# Patient Record
Sex: Female | Born: 1945 | Race: Black or African American | Hispanic: No
Health system: Southern US, Community
[De-identification: ages and names within clinical notes are randomized; demographics above are authoritative.]

## PROBLEM LIST (undated history)

## (undated) DIAGNOSIS — J45909 Unspecified asthma, uncomplicated: Secondary | ICD-10-CM

## (undated) DIAGNOSIS — I10 Essential (primary) hypertension: Secondary | ICD-10-CM

---

## 2011-07-27 HISTORY — PX: BREAST BIOPSY: SHX20

## 2017-08-03 ENCOUNTER — Other Ambulatory Visit: Payer: Self-pay | Admitting: Family Medicine

## 2017-08-03 DIAGNOSIS — Z1231 Encounter for screening mammogram for malignant neoplasm of breast: Secondary | ICD-10-CM

## 2017-09-19 ENCOUNTER — Encounter: Payer: Self-pay | Admitting: Radiology

## 2017-09-19 ENCOUNTER — Ambulatory Visit
Admission: RE | Admit: 2017-09-19 | Discharge: 2017-09-19 | Disposition: A | Discharge status: Home / Self Care | Payer: Medicare Other | Source: Ambulatory Visit | Attending: Family Medicine | Admitting: Family Medicine

## 2017-09-19 DIAGNOSIS — Z1231 Encounter for screening mammogram for malignant neoplasm of breast: Secondary | ICD-10-CM | POA: Diagnosis present

## 2017-09-23 ENCOUNTER — Other Ambulatory Visit: Payer: Self-pay | Admitting: *Deleted

## 2017-09-23 ENCOUNTER — Inpatient Hospital Stay
Admission: RE | Admit: 2017-09-23 | Discharge: 2017-09-23 | Disposition: A | Discharge status: Home / Self Care | Payer: Self-pay | Source: Ambulatory Visit | Attending: *Deleted | Admitting: *Deleted

## 2017-09-23 DIAGNOSIS — Z9289 Personal history of other medical treatment: Secondary | ICD-10-CM

## 2018-09-04 ENCOUNTER — Other Ambulatory Visit: Payer: Self-pay | Admitting: Family Medicine

## 2018-09-04 DIAGNOSIS — Z1231 Encounter for screening mammogram for malignant neoplasm of breast: Secondary | ICD-10-CM

## 2018-09-22 ENCOUNTER — Ambulatory Visit
Admission: RE | Admit: 2018-09-22 | Discharge: 2018-09-22 | Disposition: A | Discharge status: Home / Self Care | Payer: Medicare Other | Source: Ambulatory Visit | Attending: Family Medicine | Admitting: Family Medicine

## 2018-09-22 DIAGNOSIS — Z1231 Encounter for screening mammogram for malignant neoplasm of breast: Secondary | ICD-10-CM | POA: Insufficient documentation

## 2019-08-29 ENCOUNTER — Other Ambulatory Visit: Payer: Self-pay | Admitting: Family Medicine

## 2019-08-29 DIAGNOSIS — Z1231 Encounter for screening mammogram for malignant neoplasm of breast: Secondary | ICD-10-CM

## 2019-09-16 ENCOUNTER — Ambulatory Visit: Payer: Medicare Other | Attending: Internal Medicine

## 2019-09-16 ENCOUNTER — Other Ambulatory Visit: Payer: Self-pay

## 2019-09-16 DIAGNOSIS — Z23 Encounter for immunization: Secondary | ICD-10-CM

## 2019-09-16 NOTE — Progress Notes (Signed)
   Covid-19 Vaccination Clinic  Name:  Julie Roberts    MRN: [id number] DOB: [birth year]  09/16/2019  Julie Roberts was observed post Covid-19 immunization for 15 minutes without incidence. She was provided with Vaccine Information Sheet and instruction to access the V-Safe system.   Julie Roberts was instructed to call 911 with any severe reactions post vaccine: Marland Kitchen Difficulty breathing  . Swelling of your face and throat  . A fast heartbeat  . A bad rash all over your body  . Dizziness and weakness    Immunizations Administered    Name Date Dose VIS Date Route   Pfizer COVID-19 Vaccine 09/16/2019 12:30 PM 0.3 mL 07/06/2019 Intramuscular   Manufacturer: [employer], [employer]   Lot: [id number]   NDC: [address]-1

## 2019-09-25 ENCOUNTER — Ambulatory Visit
Admission: RE | Admit: 2019-09-25 | Discharge: 2019-09-25 | Disposition: A | Discharge status: Home / Self Care | Payer: Medicare Other | Source: Ambulatory Visit | Attending: Family Medicine | Admitting: Family Medicine

## 2019-09-25 DIAGNOSIS — Z1231 Encounter for screening mammogram for malignant neoplasm of breast: Secondary | ICD-10-CM | POA: Insufficient documentation

## 2019-10-10 ENCOUNTER — Ambulatory Visit: Payer: Medicare Other | Attending: Internal Medicine

## 2019-10-10 DIAGNOSIS — Z23 Encounter for immunization: Secondary | ICD-10-CM

## 2019-10-10 NOTE — Progress Notes (Signed)
   Covid-19 Vaccination Clinic  Name:  Julie Roberts    MRN: [id number] DOB: 11-14-1945  10/10/2019  [name] was observed post Covid-19 immunization for 15 minutes without incident. She was provided with Vaccine Information Sheet and instruction to access the V-Safe system.   [name] was instructed to call 911 with any severe reactions post vaccine: Marland Kitchen Difficulty breathing  . Swelling of face and throat  . A fast heartbeat  . A bad rash all over body  . Dizziness and weakness   Immunizations Administered    Name Date Dose VIS Date Route   Pfizer COVID-19 Vaccine 10/10/2019 12:18 PM 0.3 mL 07/06/2019 Intramuscular   Manufacturer: [employer], [employer]   Lot: [id number]   NDC: [address]-4

## 2019-10-22 ENCOUNTER — Other Ambulatory Visit: Payer: Self-pay | Admitting: Family Medicine

## 2019-10-22 DIAGNOSIS — R1031 Right lower quadrant pain: Secondary | ICD-10-CM

## 2019-10-22 DIAGNOSIS — R5383 Other fatigue: Secondary | ICD-10-CM

## 2019-10-22 DIAGNOSIS — R634 Abnormal weight loss: Secondary | ICD-10-CM

## 2019-11-06 ENCOUNTER — Other Ambulatory Visit: Payer: Self-pay

## 2019-11-06 ENCOUNTER — Ambulatory Visit
Admission: RE | Admit: 2019-11-06 | Discharge: 2019-11-06 | Disposition: A | Discharge status: Home / Self Care | Payer: Medicare Other | Source: Ambulatory Visit | Attending: Family Medicine | Admitting: Family Medicine

## 2019-11-06 DIAGNOSIS — R1032 Left lower quadrant pain: Secondary | ICD-10-CM | POA: Diagnosis present

## 2019-11-06 DIAGNOSIS — R1031 Right lower quadrant pain: Secondary | ICD-10-CM | POA: Diagnosis present

## 2019-11-06 DIAGNOSIS — R5383 Other fatigue: Secondary | ICD-10-CM | POA: Insufficient documentation

## 2019-11-06 DIAGNOSIS — R634 Abnormal weight loss: Secondary | ICD-10-CM | POA: Insufficient documentation

## 2019-11-06 HISTORY — DX: Essential (primary) hypertension: I10

## 2019-11-06 HISTORY — DX: Unspecified asthma, uncomplicated: J45.909

## 2019-11-06 MED ORDER — IOHEXOL 300 MG/ML  SOLN
100.0000 mL | Freq: Once | INTRAMUSCULAR | Status: AC | PRN
  Administered 2019-11-06: 100 mL via INTRAVENOUS

## 2020-07-02 ENCOUNTER — Other Ambulatory Visit: Payer: Self-pay | Admitting: Family Medicine

## 2020-07-02 DIAGNOSIS — R202 Paresthesia of skin: Secondary | ICD-10-CM

## 2020-07-02 DIAGNOSIS — R42 Dizziness and giddiness: Secondary | ICD-10-CM

## 2020-07-02 DIAGNOSIS — R413 Other amnesia: Secondary | ICD-10-CM

## 2020-07-02 DIAGNOSIS — R2681 Unsteadiness on feet: Secondary | ICD-10-CM

## 2020-07-02 DIAGNOSIS — R531 Weakness: Secondary | ICD-10-CM

## 2020-07-11 ENCOUNTER — Ambulatory Visit
Admission: RE | Admit: 2020-07-11 | Discharge: 2020-07-11 | Disposition: A | Discharge status: Home / Self Care | Payer: Medicare Other | Source: Ambulatory Visit | Attending: Family Medicine | Admitting: Family Medicine

## 2020-07-11 ENCOUNTER — Other Ambulatory Visit: Payer: Self-pay

## 2020-07-11 DIAGNOSIS — R531 Weakness: Secondary | ICD-10-CM | POA: Diagnosis present

## 2020-07-11 DIAGNOSIS — R42 Dizziness and giddiness: Secondary | ICD-10-CM | POA: Diagnosis not present

## 2020-07-11 DIAGNOSIS — R413 Other amnesia: Secondary | ICD-10-CM | POA: Insufficient documentation

## 2020-07-11 DIAGNOSIS — R2681 Unsteadiness on feet: Secondary | ICD-10-CM | POA: Diagnosis present

## 2020-07-11 DIAGNOSIS — R202 Paresthesia of skin: Secondary | ICD-10-CM | POA: Diagnosis present

## 2020-07-18 ENCOUNTER — Encounter: Admission: EM | Disposition: E | Payer: Self-pay | Source: Home / Self Care | Attending: Pulmonary Disease

## 2020-07-18 ENCOUNTER — Emergency Department: Payer: Medicare Other

## 2020-07-18 ENCOUNTER — Inpatient Hospital Stay: Payer: Medicare Other

## 2020-07-18 ENCOUNTER — Inpatient Hospital Stay
Admission: EM | Admit: 2020-07-18 | Discharge: 2020-07-26 | DRG: 682 | Disposition: E | Payer: Medicare Other | Attending: Internal Medicine | Admitting: Internal Medicine

## 2020-07-18 DIAGNOSIS — R0600 Dyspnea, unspecified: Secondary | ICD-10-CM

## 2020-07-18 DIAGNOSIS — J9601 Acute respiratory failure with hypoxia: Secondary | ICD-10-CM | POA: Diagnosis not present

## 2020-07-18 DIAGNOSIS — Z515 Encounter for palliative care: Secondary | ICD-10-CM

## 2020-07-18 DIAGNOSIS — J9691 Respiratory failure, unspecified with hypoxia: Secondary | ICD-10-CM

## 2020-07-18 DIAGNOSIS — M1A9XX0 Chronic gout, unspecified, without tophus (tophi): Secondary | ICD-10-CM | POA: Diagnosis present

## 2020-07-18 DIAGNOSIS — D631 Anemia in chronic kidney disease: Secondary | ICD-10-CM | POA: Diagnosis present

## 2020-07-18 DIAGNOSIS — G9341 Metabolic encephalopathy: Secondary | ICD-10-CM | POA: Diagnosis present

## 2020-07-18 DIAGNOSIS — N182 Chronic kidney disease, stage 2 (mild): Secondary | ICD-10-CM | POA: Diagnosis present

## 2020-07-18 DIAGNOSIS — I340 Nonrheumatic mitral (valve) insufficiency: Secondary | ICD-10-CM | POA: Diagnosis not present

## 2020-07-18 DIAGNOSIS — J9600 Acute respiratory failure, unspecified whether with hypoxia or hypercapnia: Secondary | ICD-10-CM

## 2020-07-18 DIAGNOSIS — N179 Acute kidney failure, unspecified: Secondary | ICD-10-CM | POA: Diagnosis present

## 2020-07-18 DIAGNOSIS — I959 Hypotension, unspecified: Secondary | ICD-10-CM | POA: Diagnosis not present

## 2020-07-18 DIAGNOSIS — J45909 Unspecified asthma, uncomplicated: Secondary | ICD-10-CM | POA: Diagnosis present

## 2020-07-18 DIAGNOSIS — Z79899 Other long term (current) drug therapy: Secondary | ICD-10-CM | POA: Diagnosis not present

## 2020-07-18 DIAGNOSIS — J189 Pneumonia, unspecified organism: Secondary | ICD-10-CM | POA: Diagnosis present

## 2020-07-18 DIAGNOSIS — N390 Urinary tract infection, site not specified: Secondary | ICD-10-CM | POA: Diagnosis present

## 2020-07-18 DIAGNOSIS — R680 Hypothermia, not associated with low environmental temperature: Secondary | ICD-10-CM | POA: Diagnosis present

## 2020-07-18 DIAGNOSIS — B9689 Other specified bacterial agents as the cause of diseases classified elsewhere: Secondary | ICD-10-CM | POA: Diagnosis present

## 2020-07-18 DIAGNOSIS — T68XXXA Hypothermia, initial encounter: Secondary | ICD-10-CM

## 2020-07-18 DIAGNOSIS — N178 Other acute kidney failure: Secondary | ICD-10-CM

## 2020-07-18 DIAGNOSIS — I361 Nonrheumatic tricuspid (valve) insufficiency: Secondary | ICD-10-CM | POA: Diagnosis not present

## 2020-07-18 DIAGNOSIS — I129 Hypertensive chronic kidney disease with stage 1 through stage 4 chronic kidney disease, or unspecified chronic kidney disease: Secondary | ICD-10-CM | POA: Diagnosis present

## 2020-07-18 DIAGNOSIS — J9692 Respiratory failure, unspecified with hypercapnia: Secondary | ICD-10-CM

## 2020-07-18 DIAGNOSIS — R001 Bradycardia, unspecified: Secondary | ICD-10-CM

## 2020-07-18 DIAGNOSIS — Z4659 Encounter for fitting and adjustment of other gastrointestinal appliance and device: Secondary | ICD-10-CM

## 2020-07-18 DIAGNOSIS — Z20822 Contact with and (suspected) exposure to covid-19: Secondary | ICD-10-CM | POA: Diagnosis present

## 2020-07-18 DIAGNOSIS — R0603 Acute respiratory distress: Secondary | ICD-10-CM | POA: Diagnosis not present

## 2020-07-18 DIAGNOSIS — I498 Other specified cardiac arrhythmias: Secondary | ICD-10-CM | POA: Diagnosis present

## 2020-07-18 DIAGNOSIS — I468 Cardiac arrest due to other underlying condition: Secondary | ICD-10-CM | POA: Diagnosis not present

## 2020-07-18 DIAGNOSIS — R0602 Shortness of breath: Secondary | ICD-10-CM

## 2020-07-18 DIAGNOSIS — E872 Acidosis: Secondary | ICD-10-CM | POA: Diagnosis present

## 2020-07-18 DIAGNOSIS — E785 Hyperlipidemia, unspecified: Secondary | ICD-10-CM | POA: Diagnosis present

## 2020-07-18 DIAGNOSIS — Z452 Encounter for adjustment and management of vascular access device: Secondary | ICD-10-CM

## 2020-07-18 DIAGNOSIS — M8580 Other specified disorders of bone density and structure, unspecified site: Secondary | ICD-10-CM | POA: Diagnosis present

## 2020-07-18 DIAGNOSIS — Z7982 Long term (current) use of aspirin: Secondary | ICD-10-CM

## 2020-07-18 DIAGNOSIS — Z66 Do not resuscitate: Secondary | ICD-10-CM | POA: Diagnosis not present

## 2020-07-18 DIAGNOSIS — R4182 Altered mental status, unspecified: Secondary | ICD-10-CM | POA: Diagnosis not present

## 2020-07-18 DIAGNOSIS — Z01818 Encounter for other preprocedural examination: Secondary | ICD-10-CM

## 2020-07-18 HISTORY — PX: TEMPORARY PACEMAKER: CATH118268

## 2020-07-18 LAB — CBC WITH DIFFERENTIAL/PLATELET
Abs Immature Granulocytes: 0.12 10*3/uL — ABNORMAL HIGH (ref 0.00–0.07)
Basophils Absolute: 0 10*3/uL (ref 0.0–0.1)
Basophils Relative: 0 %
Eosinophils Absolute: 0.1 10*3/uL (ref 0.0–0.5)
Eosinophils Relative: 0 %
HCT: 25.3 % — ABNORMAL LOW (ref 36.0–46.0)
Hemoglobin: 8.6 g/dL — ABNORMAL LOW (ref 12.0–15.0)
Immature Granulocytes: 1 %
Lymphocytes Relative: 19 %
Lymphs Abs: 2.1 10*3/uL (ref 0.7–4.0)
MCH: 31.4 pg (ref 26.0–34.0)
MCHC: 34 g/dL (ref 30.0–36.0)
MCV: 92.3 fL (ref 80.0–100.0)
Monocytes Absolute: 0.5 10*3/uL (ref 0.1–1.0)
Monocytes Relative: 5 %
Neutro Abs: 8.5 10*3/uL — ABNORMAL HIGH (ref 1.7–7.7)
Neutrophils Relative %: 75 %
Platelets: 142 10*3/uL — ABNORMAL LOW (ref 150–400)
RBC: 2.74 MIL/uL — ABNORMAL LOW (ref 3.87–5.11)
RDW: 16.3 % — ABNORMAL HIGH (ref 11.5–15.5)
Smear Review: NORMAL
WBC: 11.4 10*3/uL — ABNORMAL HIGH (ref 4.0–10.5)
nRBC: 4.6 % — ABNORMAL HIGH (ref 0.0–0.2)

## 2020-07-18 LAB — BASIC METABOLIC PANEL
Anion gap: 10 (ref 5–15)
BUN: 55 mg/dL — ABNORMAL HIGH (ref 8–23)
CO2: 20 mmol/L — ABNORMAL LOW (ref 22–32)
Calcium: 9.7 mg/dL (ref 8.9–10.3)
Chloride: 103 mmol/L (ref 98–111)
Creatinine, Ser: 3.49 mg/dL — ABNORMAL HIGH (ref 0.44–1.00)
GFR, Estimated: 13 mL/min — ABNORMAL LOW (ref >60)
Glucose, Bld: 121 mg/dL — ABNORMAL HIGH (ref 70–99)
Potassium: 5.5 mmol/L — ABNORMAL HIGH (ref 3.5–5.1)
Sodium: 133 mmol/L — ABNORMAL LOW (ref 135–145)

## 2020-07-18 LAB — RESP PANEL BY RT-PCR (FLU A&B, COVID) ARPGX2
Influenza A by PCR: NEGATIVE
Influenza B by PCR: NEGATIVE
SARS Coronavirus 2 by RT PCR: NEGATIVE

## 2020-07-18 LAB — CBG MONITORING, ED: Glucose-Capillary: 106 mg/dL — ABNORMAL HIGH (ref 70–99)

## 2020-07-18 LAB — COMPREHENSIVE METABOLIC PANEL
ALT: 56 U/L — ABNORMAL HIGH (ref 0–44)
AST: 61 U/L — ABNORMAL HIGH (ref 15–41)
Albumin: 3.1 g/dL — ABNORMAL LOW (ref 3.5–5.0)
Alkaline Phosphatase: 152 U/L — ABNORMAL HIGH (ref 38–126)
Anion gap: 13 (ref 5–15)
BUN: 56 mg/dL — ABNORMAL HIGH (ref 8–23)
CO2: 20 mmol/L — ABNORMAL LOW (ref 22–32)
Calcium: 10.4 mg/dL — ABNORMAL HIGH (ref 8.9–10.3)
Chloride: 98 mmol/L (ref 98–111)
Creatinine, Ser: 3.31 mg/dL — ABNORMAL HIGH (ref 0.44–1.00)
GFR, Estimated: 14 mL/min — ABNORMAL LOW (ref >60)
Glucose, Bld: 128 mg/dL — ABNORMAL HIGH (ref 70–99)
Potassium: 5.5 mmol/L — ABNORMAL HIGH (ref 3.5–5.1)
Sodium: 131 mmol/L — ABNORMAL LOW (ref 135–145)
Total Bilirubin: 0.5 mg/dL (ref 0.3–1.2)
Total Protein: 6.2 g/dL — ABNORMAL LOW (ref 6.5–8.1)

## 2020-07-18 LAB — LACTIC ACID, PLASMA
Lactic Acid, Venous: 3.3 mmol/L (ref 0.5–1.9)
Lactic Acid, Venous: 2.5 mmol/L (ref 0.5–1.9)

## 2020-07-18 LAB — PROTIME-INR
INR: 1 (ref 0.8–1.2)
Prothrombin Time: 12.6 seconds (ref 11.4–15.2)

## 2020-07-18 LAB — APTT: aPTT: 41 seconds — ABNORMAL HIGH (ref 24–36)

## 2020-07-18 LAB — MRSA PCR SCREENING: MRSA by PCR: NEGATIVE

## 2020-07-18 LAB — GLUCOSE, CAPILLARY
Glucose-Capillary: 122 mg/dL — ABNORMAL HIGH (ref 70–99)
Glucose-Capillary: 111 mg/dL — ABNORMAL HIGH (ref 70–99)

## 2020-07-18 SURGERY — TEMPORARY PACEMAKER

## 2020-07-18 MED ORDER — DOPAMINE-DEXTROSE 3.2-5 MG/ML-% IV SOLN
0.0000 ug/kg/min | INTRAVENOUS | Status: DC
  Administered 2020-07-18: 18 ug/kg/min via INTRAVENOUS
  Administered 2020-07-18: 5 ug/kg/min via INTRAVENOUS
  Filled 2020-07-18: qty 250

## 2020-07-18 MED ORDER — CHLORHEXIDINE GLUCONATE CLOTH 2 % EX PADS
6.0000 | MEDICATED_PAD | Freq: Every day | CUTANEOUS | Status: DC
  Administered 2020-07-19 – 2020-07-24 (×5): 6 via TOPICAL

## 2020-07-18 MED ORDER — VASOPRESSIN 20 UNITS/100 ML INFUSION FOR SHOCK
0.0000 [IU]/min | INTRAVENOUS | Status: DC
  Administered 2020-07-18: 23:00:00 0.03 [IU]/min via INTRAVENOUS
  Administered 2020-07-19: 06:00:00 0.04 [IU]/min via INTRAVENOUS
  Filled 2020-07-18 (×4): qty 100

## 2020-07-18 MED ORDER — SODIUM CHLORIDE 0.9 % IV SOLN: INTRAVENOUS | Status: DC

## 2020-07-18 MED ORDER — MIDAZOLAM HCL 2 MG/2ML IJ SOLN
INTRAMUSCULAR | Status: AC
  Filled 2020-07-18: qty 2

## 2020-07-18 MED ORDER — FENTANYL CITRATE (PF) 100 MCG/2ML IJ SOLN
INTRAMUSCULAR | Status: AC
  Filled 2020-07-18: qty 2

## 2020-07-18 MED ORDER — DOPAMINE-DEXTROSE 3.2-5 MG/ML-% IV SOLN: 0.0000 ug/kg/min | INTRAVENOUS | Status: DC

## 2020-07-18 MED ORDER — FENTANYL CITRATE (PF) 100 MCG/2ML IJ SOLN: 50.0000 ug | Freq: Once | INTRAMUSCULAR | Status: AC

## 2020-07-18 MED ORDER — LIDOCAINE HCL (PF) 1 % IJ SOLN
INTRAMUSCULAR | Status: AC
  Filled 2020-07-18: qty 30

## 2020-07-18 MED ORDER — HEPARIN (PORCINE) IN NACL 1000-0.9 UT/500ML-% IV SOLN
INTRAVENOUS | Status: AC
  Filled 2020-07-18: qty 1000

## 2020-07-18 MED ORDER — MIDAZOLAM HCL 2 MG/2ML IJ SOLN: 2.0000 mg | Freq: Once | INTRAMUSCULAR | Status: AC

## 2020-07-18 MED ORDER — SODIUM BICARBONATE 8.4 % IV SOLN
50.0000 meq | Freq: Once | INTRAVENOUS | Status: AC
  Administered 2020-07-18: 23:00:00 50 meq via INTRAVENOUS
  Filled 2020-07-18: qty 50

## 2020-07-18 MED ORDER — FENTANYL CITRATE (PF) 100 MCG/2ML IJ SOLN
INTRAMUSCULAR | Status: AC
  Administered 2020-07-18: 50 ug via INTRAVENOUS
  Filled 2020-07-18: qty 2

## 2020-07-18 MED ORDER — HEPARIN (PORCINE) IN NACL 1000-0.9 UT/500ML-% IV SOLN
INTRAVENOUS | Status: DC | PRN
  Administered 2020-07-18 (×2): 500 mL

## 2020-07-18 MED ORDER — LIDOCAINE HCL (PF) 1 % IJ SOLN
INTRAMUSCULAR | Status: DC | PRN
  Administered 2020-07-18: 15 mL

## 2020-07-18 MED ORDER — DOPAMINE-DEXTROSE 3.2-5 MG/ML-% IV SOLN
0.0000 ug/kg/min | INTRAVENOUS | Status: DC
  Administered 2020-07-18 – 2020-07-19 (×3): 20 ug/kg/min via INTRAVENOUS
  Administered 2020-07-20: 5 ug/kg/min via INTRAVENOUS
  Filled 2020-07-18 (×3): qty 250

## 2020-07-18 MED ORDER — MIDAZOLAM HCL 2 MG/2ML IJ SOLN
INTRAMUSCULAR | Status: AC
  Administered 2020-07-18: 2 mg via INTRAVENOUS
  Filled 2020-07-18: qty 2

## 2020-07-18 MED ORDER — DOPAMINE-DEXTROSE 3.2-5 MG/ML-% IV SOLN
INTRAVENOUS | Status: AC
  Administered 2020-07-18: 5 ug/kg/min via INTRAVENOUS
  Filled 2020-07-18: qty 250

## 2020-07-18 SURGICAL SUPPLY — 8 items
CABLE ADAPT PACING TEMP 12FT (ADAPTER) ×2 IMPLANT
CANNULA 5F STIFF (CANNULA) ×2 IMPLANT
KIT MANI 3VAL PERCEP (MISCELLANEOUS) ×2 IMPLANT
NEEDLE PERC 18GX7CM (NEEDLE) ×2 IMPLANT
PACK CARDIAC CATH (CUSTOM PROCEDURE TRAY) ×2 IMPLANT
SHEATH AVANTI 6FR X 11CM (SHEATH) ×2 IMPLANT
SLEEVE REPOSITIONING LENGTH 30 (MISCELLANEOUS) ×2 IMPLANT
WIRE PACING TEMP ST TIP 5 (CATHETERS) ×2 IMPLANT

## 2020-07-18 NOTE — ED Notes (Signed)
CODE STEMI   CALLED  TO  [facility] PER  DR  GOODMAN  MD 

## 2020-07-18 NOTE — ED Notes (Signed)
Bear warmer placed on pt. Warm fluid bolus given

## 2020-07-18 NOTE — ED Notes (Addendum)
Pt paced at 46ma

## 2020-07-18 NOTE — ED Triage Notes (Signed)
Pt to ED via [facility] from across the street. Daughter called EMS due to AMS. Upon arrival pt HR in the 20s. EMS initiated transcutaneous pacing at 26ma 60bmp. EMS unable to obtain 12 -lead prior to initiating pacing.   CBG 160. BP 146/121. 20g RAC. Unknown hx at this time.

## 2020-07-18 NOTE — ED Provider Notes (Signed)
[facility] Emergency Department Provider Note  ____________________________________________   I have reviewed the triage vital signs and the nursing notes.   HISTORY  Chief Complaint Decreased responsiveness  History limited by: Altered Mental Status   HPI Julie Roberts is a 74 y.o. female who presents to the emergency department today because of concern for decreased responsiveness. The patient is unable to give any history. Per ems daughter at scene stated patient had not been doing well for weeks. They stated that she is being worked up for possible dementia. When they arrived on scene the found the patient to be unresponsive with significant bradycardia. They did start pacing the patient with improvement in the patient's alertness.  Records reviewed. Per medical record review patient has a history of asthma, hypertension.  Past Medical History:  Diagnosis Date   Asthma    Hypertension     There are no problems to display for this patient.   Past Surgical History:  Procedure Laterality Date   BREAST BIOPSY Left 2013   neg    Prior to Admission medications   Not on File    Allergies Patient has no allergy information on record.  Family History  Problem Relation Age of Onset   Breast cancer Neg Hx     Social History    Review of Systems Unable to obtain secondary to AMS ____________________________________________   PHYSICAL EXAM:  VITAL SIGNS: ED Triage Vitals  Enc Vitals Group     BP 07/08/2020 1533 (!) 149/129     Pulse Rate 07/15/2020 1533 60     Resp 07/08/2020 1533 15     Temp 07/08/2020 1542 (!) 86.3 F (30.2 C)     Temp Source 07/17/2020 1542 Rectal     SpO2 07/17/2020 1533 100 %   Constitutional: Somnolent, awakens to loud verbal stimuli.  Eyes: Conjunctivae are normal.  ENT      Head: Normocephalic and atraumatic.      Nose: No congestion/rhinnorhea.      Mouth/Throat: Mucous membranes are moist.      Neck: No  stridor. Hematological/Lymphatic/Immunilogical: No cervical lymphadenopathy. Cardiovascular: Normal rate, regular rhythm.  No murmurs, rubs, or gallops.  Respiratory: Normal respiratory effort without tachypnea nor retractions. Breath sounds are clear and equal bilaterally. No wheezes/rales/rhonchi. Gastrointestinal: Soft and non tender. No rebound. No guarding.  Genitourinary: Deferred Musculoskeletal: Normal range of motion in all extremities. No lower extremity edema. Neurologic:  Somnolent.  Skin:  Skin is warm, dry and intact. No rash noted.  ____________________________________________    LABS (pertinent positives/negatives)  COVID negative Lactic acid 3.3 CMP na 131, k 5.5, glu 128, cr 3.31 CBC wbc 11.4, hgb 8.6, plt 142  ____________________________________________   EKG  I, Phineas Semen, attending physician, personally viewed and interpreted this EKG  EKG Time: 1531 Rate: 18 Rhythm: no obvious sinus rhythm, possible bradycardic junctional rhythm Axis: unable to determine Intervals: no obvious p waves, qtc appears normal QRS: narrow ST changes: no st elevation Impression: abnormal ekg   ____________________________________________    RADIOLOGY  CXR Cardiomegaly with pulmonary edema. Question atypical infection  ____________________________________________   PROCEDURES  Procedures  CRITICAL CARE Performed by: Phineas Semen   Total critical care time: 35 minutes  Critical care time was exclusive of separately billable procedures and treating other patients.  Critical care was necessary to treat or prevent imminent or life-threatening deterioration.  Critical care was time spent personally by me on the following activities: development of treatment plan with patient and/or  surrogate as well as nursing, discussions with consultants, evaluation of patient's response to treatment, examination of patient, obtaining history from patient or surrogate,  ordering and performing treatments and interventions, ordering and review of laboratory studies, ordering and review of radiographic studies, pulse oximetry and re-evaluation of patient's condition.  ____________________________________________   INITIAL IMPRESSION / ASSESSMENT AND PLAN / ED COURSE  Pertinent labs & imaging results that were available during my care of the patient were reviewed by me and considered in my medical decision making (see chart for details).   Patient presented to the emergency room via EMS because of concerns for decreased responsiveness and having been found to be significantly bradycardic.  Patient was being paced by EMS upon arrival to the emergency department.  Patient did have EKG performed while being transferred to our pacer pads.  This showed significant bradycardia with roughly 18 bpm.  No obvious P waves.  She however did well on external pacing.  She did become somewhat more alert although was in some significant discomfort.  Because of this she was given medications to help with the pain and discomfort.  I did discuss with Dr. Kirke Corin with cardiology who graciously agreed to come to the emergency department to evaluate and potentially place temporary pacer.  Patient will be admitted to the ICU.   ____________________________________________   FINAL CLINICAL IMPRESSION(S) / ED DIAGNOSES  Final diagnoses:  Hypothermia, initial encounter  Bradycardia  Altered mental status, unspecified altered mental status type     Note: This dictation was prepared with Dragon dictation. Any transcriptional errors that result from this process are unintentional     Phineas Semen, MD 06/30/2020 [phone]

## 2020-07-18 NOTE — ED Notes (Addendum)
Pt placed on ED monitor. Pacing stopped momentarily to obtain EKG. HR reading 18bmp. Pacing initiated again

## 2020-07-18 NOTE — H&P (Signed)
CRITICAL CARE PROGRESS NOTE    Name: Julie Roberts MRN: [id number] DOB: 04/01/1946     LOS: 0   SUBJECTIVE FINDINGS & SIGNIFICANT EVENTS    Patient description:   74 yo F PMH as below, daughter gives history as patient is with encephalopathy.  Daughter states despite significant comorbid history she was able to drive 1 month ago and does all ADLs on her own.  She has been unsteady on feet with disequilibrium over past 2 months and she has had MRI brian for possible CVA on outpatient.  She was brought to ED due to worsening dizziness and altered mentation and found to have profound bradycardia in the teens.She had transcutaneous pacing placed. She was emergently seen by cardiology who was able to place transvenous TPM.  She was also noted to have A/CKD and hypothermia with admission to MICU for additional evaluation and management.   Lines/tubes :   Microbiology/Sepsis markers: Results for orders placed or performed during the hospital encounter of 07/22/2020  Resp Panel by RT-PCR (Flu A&B, Covid) Nasopharyngeal Swab     Status: None   Collection Time: 07/08/2020  4:00 PM   Specimen: Nasopharyngeal Swab; Nasopharyngeal(NP) swabs in vial transport medium  Result Value Ref Range Status   SARS Coronavirus 2 by RT PCR NEGATIVE NEGATIVE Final    Comment: (NOTE) SARS-CoV-2 target nucleic acids are NOT DETECTED.  The SARS-CoV-2 RNA is generally detectable in upper respiratory specimens during the acute phase of infection. The lowest concentration of SARS-CoV-2 viral copies this assay can detect is 138 copies/mL. A negative result does not preclude SARS-Cov-2 infection and should not be used as the sole basis for treatment or other patient management decisions. A negative result may occur with  improper specimen  collection/handling, submission of specimen other than nasopharyngeal swab, presence of viral mutation(s) within the areas targeted by this assay, and inadequate number of viral copies(<138 copies/mL). A negative result must be combined with clinical observations, patient history, and epidemiological information. The expected result is Negative.  Fact Sheet for Patients:  BloggerCourse.comhttps://www.fda.gov/media/152166/download  Fact Sheet for Healthcare Providers:  SeriousBroker.ithttps://www.fda.gov/media/152162/download  This test is no t yet approved or cleared by the [place] FDA and  has been authorized for detection and/or diagnosis of SARS-CoV-2 by FDA under an Emergency Use Authorization (EUA). This EUA will remain  in effect (meaning this test can be used) for the duration of the COVID-19 declaration under Section 564(b)(1) of the Act, 21 U.S.C.section 360bbb-3(b)(1), unless the authorization is terminated  or revoked sooner.       Influenza A by PCR NEGATIVE NEGATIVE Final   Influenza B by PCR NEGATIVE NEGATIVE Final    Comment: (NOTE) The Xpert Xpress SARS-CoV-2/FLU/RSV plus assay is intended as an aid in the diagnosis of influenza from Nasopharyngeal swab specimens and should not be used as a sole basis for treatment. Nasal washings and aspirates are unacceptable for Xpert Xpress SARS-CoV-2/FLU/RSV testing.  Fact Sheet for Patients: BloggerCourse.comhttps://www.fda.gov/media/152166/download  Fact Sheet for Healthcare Providers: SeriousBroker.ithttps://www.fda.gov/media/152162/download  This test is not yet approved or cleared by the [place] FDA and has been authorized for detection and/or diagnosis of SARS-CoV-2 by FDA under an Emergency Use Authorization (EUA). This EUA will remain in effect (meaning this test can be used) for the duration of the COVID-19 declaration under Section 564(b)(1) of the Act, 21 U.S.C. section 360bbb-3(b)(1), unless the authorization is terminated or revoked.  Performed at [facility], [address] [place], [place] [address]  Anti-infectives:  Anti-infectives (From admission, onward)   None       Consults: Treatment Team:  Iran Ouch, MD      PAST MEDICAL HISTORY   Past Medical History:  Diagnosis Date  . Asthma   . Hypertension      SURGICAL HISTORY   Past Surgical History:  Procedure Laterality Date  . BREAST BIOPSY Left 2013   neg     FAMILY HISTORY   Family History  Problem Relation Age of Onset  . Breast cancer Neg Hx      SOCIAL HISTORY       MEDICATIONS   Current Medication:  Current Facility-Administered Medications:  .  0.9 %  sodium chloride infusion, , Intravenous, Continuous, Arida, Chelsea Aus, MD .  Melene Muller ON 07/19/2020] Chlorhexidine Gluconate Cloth 2 % PADS 6 each, 6 each, Topical, Q0600, Karna Christmas, Yasmene Salomone, MD .  DOPamine (INTROPIN) 800 mg in dextrose 5 % 250 mL (3.2 mg/mL) infusion, 0-20 mcg/kg/min, Intravenous, Continuous, Arida, Muhammad A, MD, Last Rate: 6.81 mL/hr at 07/17/2020 1626, 5 mcg/kg/min at 07/17/2020 1626    ALLERGIES   Patient has no allergy information on record.    REVIEW OF SYSTEMS    Unable to obtain due to encephalopathy  PHYSICAL EXAMINATION   Vital Signs: Temp:  [85.9 F (29.9 C)-86.9 F (30.5 C)] 86.9 F (30.5 C) (12/24 1748) Pulse Rate:  [53-60] 53 (12/24 1748) Resp:  [5-27] 17 (12/24 1748) BP: (67-149)/(27-129) 107/47 (12/24 1748) SpO2:  [94 %-100 %] 100 % (12/24 1748) Weight:  [72.6 kg] 72.6 kg (12/24 1620)  GENERAL:age appropriate HEAD: Normocephalic, atraumatic.  EYES: Pupils equal, round, reactive to light.  No scleral icterus.  MOUTH: Moist mucosal membrane. NECK: Supple. No thyromegaly. No nodules. No JVD.  PULMONARY: decreased air entry bilaterally  CARDIOVASCULAR: S1 and S2. Regular rate and rhythm. No murmurs, rubs, or gallops.  GASTROINTESTINAL: Soft, nontender, non-distended. No masses. Positive bowel sounds. No hepatosplenomegaly.   MUSCULOSKELETAL: No swelling, clubbing, or edema.  NEUROLOGIC: Mild distress due to acute illness SKIN:intact,warm,dry   PERTINENT DATA     Infusions: . sodium chloride    . DOPamine 5 mcg/kg/min (07/08/2020 1626)   Scheduled Medications: . [START ON 07/19/2020] Chlorhexidine Gluconate Cloth  6 each Topical Q0600   PRN Medications:  Hemodynamic parameters:   Intake/Output: No intake/output data recorded.  Ventilator  Settings:     LAB RESULTS:  Basic Metabolic Panel: Recent Labs  Lab 07/07/2020 1600  NA 131*  K 5.5*  CL 98  CO2 20*  GLUCOSE 128*  BUN 56*  CREATININE 3.31*  CALCIUM 10.4*   Liver Function Tests: Recent Labs  Lab 07/12/2020 1600  AST 61*  ALT 56*  ALKPHOS 152*  BILITOT 0.5  PROT 6.2*  ALBUMIN 3.1*   No results for input(s): LIPASE, AMYLASE in the last 168 hours. No results for input(s): AMMONIA in the last 168 hours. CBC: Recent Labs  Lab 07/05/2020 1600  WBC 11.4*  NEUTROABS 8.5*  HGB 8.6*  HCT 25.3*  MCV 92.3  PLT 142*   Cardiac Enzymes: No results for input(s): CKTOTAL, CKMB, CKMBINDEX, TROPONINI in the last 168 hours. BNP: Invalid input(s): POCBNP CBG: Recent Labs  Lab 07/13/2020 1547  GLUCAP 106*       IMAGING RESULTS:  Imaging: CARDIAC CATHETERIZATION  Result Date: 06/29/2020 Successful transvenous pacemaker placement via the right common femoral vein. Recommendations: During pacemaker placement, the patient was noted to have some intrinsic rhythm with heart rate in the high 40s  and low 50s.  Thus, I elected to start the pacemaker rate at 40 bpm to allow intrinsic rhythm.  The patient's hypotension improved with IV fluids and dopamine. Recommend admission to the intensive care unit for further work-up of hypothermia, anemia and acute renal failure. Continue to hold all antihypertensive medications including metoprolol which is the likely culprit for bradycardia in the setting of acute renal failure with subsequent  accumulation. I suspect that the patient's most likely will not require permanent pacemaker.  Once we know that her bradycardia has resolved, the temporary pacemaker can be removed.  DG Chest Port 1 View  Result Date: 06/25/2020 CLINICAL DATA:  74 year old female status post pacemaker placement. EXAM: PORTABLE CHEST 1 VIEW COMPARISON:  Chest radiograph dated 07/22/2020. FINDINGS: There is cardiomegaly with vascular congestion and probable mild edema. Patchy area of density in the left mid lung field may represent edema or pneumonia. No pleural effusion pneumothorax. No acute osseous pathology. A pacer wire noted with tip over the spine. IMPRESSION: Mild cardiomegaly and mild vascular congestion. Patchy density in the left mid lung field may represent edema or pneumonia. No pneumothorax. Electronically Signed   By: Elgie Collard M.D.   On: 07/20/2020 18:06   DG Chest Port 1 View  Result Date: 07/05/2020 CLINICAL DATA:  Questionable sepsis. EXAM: PORTABLE CHEST 1 VIEW COMPARISON:  None. FINDINGS: The heart size is enlarged. Evaluation of the left lung field is limited by an overlying defibrillator pad. There appear to be diffuse hazy airspace opacities in the left lung field and to a lesser degree the right. Kerley B lines are noted. There are probable trace bilateral pleural effusions. There is no pneumothorax. There is an old left clavicle fracture. No definite acute displaced fracture. IMPRESSION: 1. Cardiomegaly with pulmonary edema. 2. Probable trace bilateral pleural effusions. 3. Hazy airspace opacities, left greater than right, may represent an atypical infectious process or developing pulmonary edema. Electronically Signed   By: Katherine Mantle M.D.   On: 07/12/2020 16:14   @PROBHOSP @ CARDIAC CATHETERIZATION  Result Date: 07/13/2020 Successful transvenous pacemaker placement via the right common femoral vein. Recommendations: During pacemaker placement, the patient was noted to have some  intrinsic rhythm with heart rate in the high 40s and low 50s.  Thus, I elected to start the pacemaker rate at 40 bpm to allow intrinsic rhythm.  The patient's hypotension improved with IV fluids and dopamine. Recommend admission to the intensive care unit for further work-up of hypothermia, anemia and acute renal failure. Continue to hold all antihypertensive medications including metoprolol which is the likely culprit for bradycardia in the setting of acute renal failure with subsequent accumulation. I suspect that the patient's most likely will not require permanent pacemaker.  Once we know that her bradycardia has resolved, the temporary pacemaker can be removed.  DG Chest Port 1 View  Result Date: 06/26/2020 CLINICAL DATA:  74 year old female status post pacemaker placement. EXAM: PORTABLE CHEST 1 VIEW COMPARISON:  Chest radiograph dated 07/10/2020. FINDINGS: There is cardiomegaly with vascular congestion and probable mild edema. Patchy area of density in the left mid lung field may represent edema or pneumonia. No pleural effusion pneumothorax. No acute osseous pathology. A pacer wire noted with tip over the spine. IMPRESSION: Mild cardiomegaly and mild vascular congestion. Patchy density in the left mid lung field may represent edema or pneumonia. No pneumothorax. Electronically Signed   By: 07/20/2020 M.D.   On: 07/23/2020 18:06   DG Chest Port 1 View  Result Date:  07/25/2020 CLINICAL DATA:  Questionable sepsis. EXAM: PORTABLE CHEST 1 VIEW COMPARISON:  None. FINDINGS: The heart size is enlarged. Evaluation of the left lung field is limited by an overlying defibrillator pad. There appear to be diffuse hazy airspace opacities in the left lung field and to a lesser degree the right. Kerley B lines are noted. There are probable trace bilateral pleural effusions. There is no pneumothorax. There is an old left clavicle fracture. No definite acute displaced fracture. IMPRESSION: 1. Cardiomegaly with  pulmonary edema. 2. Probable trace bilateral pleural effusions. 3. Hazy airspace opacities, left greater than right, may represent an atypical infectious process or developing pulmonary edema. Electronically Signed   By: Katherine Mantle M.D.   On: 07/17/2020 16:14     ASSESSMENT AND PLAN    -Multidisciplinary rounds held today  Symptomatic bradyarrythmia   Cardiology on case - Dr Kirke Corin - appreciate input - likely due to CKD with reaccumulation of BB -please follow cardiology recommendations   Acute on chronic renal failure  - d/c nonessential nephrotoxins ICU monitoring -follow chem 7 -follow UO -continue Foley Catheter-assess need daily   Altered mental status with encephalopathy    -patient is at risk of inability to protect airway    - likely due to toxic metabolic encephalopathy Wake up assessment pending   ID -continue IV abx as prescibed -follow up cultures  GI/Nutrition GI PROPHYLAXIS as indicated DIET-->TF's as tolerated Constipation protocol as indicated  ENDO - ICU hypoglycemic\Hyperglycemia protocol -check FSBS per protocol   ELECTROLYTES -follow labs as needed -replace as needed -pharmacy consultation   DVT/GI PRX ordered -SCDs  TRANSFUSIONS AS NEEDED MONITOR FSBS ASSESS the need for LABS as needed   Critical care provider statement:    Critical care time (minutes):  109   Critical care time was exclusive of:  Separately billable procedures and treating other patients   Critical care was necessary to treat or prevent imminent or life-threatening deterioration of the following conditions:  altered mental status, encephalopathy, symptomatic bradyarrythmia, Acute on chronic renal failure, multiple comorbid conditions.   Critical care was time spent personally by me on the following activities:  Development of treatment plan with patient or surrogate, discussions with consultants, evaluation of patient's response to treatment, examination of  patient, obtaining history from patient or surrogate, ordering and performing treatments and interventions, ordering and review of laboratory studies and re-evaluation of patient's condition.  I assumed direction of critical care for this patient from another provider in my specialty: no    This document was prepared using Dragon voice recognition software and may include unintentional dictation errors.    Vida Rigger, M.D.  Division of Pulmonary & Critical Care Medicine  [facility]

## 2020-07-18 NOTE — Progress Notes (Signed)
Clarified pts code status with pts daughter Melvia Heaps via telephone and provided an update regarding pts condition.  Mrs. Tiburcio Pea stated her mother is a FULL CODE, therefore will place order in pts chart.  Sonda Rumble, AGNP  Pulmonary/Critical Care Pager [phone] (please enter 7 digits) PCCM Consult Pager [phone] (please enter 7 digits)

## 2020-07-18 NOTE — Consult Note (Signed)
Cardiology Consultation:   Patient ID: Julie Roberts MRN: [id number]; DOB: [birth year]  Admit date: 07/10/2020 Date of Consult: 07/14/2020  Primary Care Provider: Wilford Corner, PA-C [facility] Cardiologist: No primary care provider on file.  [facility] Electrophysiologist:  None    Patient Profile:   Julie Roberts is a 74 y.o. female with a hx of essential hypertension, hyperlipidemia, chronic kidney disease, COPD and recent memory problems who is being seen today for the evaluation of symptomatic bradycardia at the request of Dr. Derrill Kay.  History of Present Illness:   Ms. Lafavor is a 74 year old female with above medical problems.  The patient has been having recent issues with worsening memory and balance problems and had a recent MRI.  She lives by herself.  She was last seen by her family on Wednesday and overall was not feeling great.  They went to see her today and she was minimally responsive.  EMS were called and the patient was noted to be bradycardic with heart rate around 15 bpm.  She was transcutaneously paced and transferred to the emergency department.  She was minimally responsive but started talking some as she arrived to the ED.  She was noted to be hypertensive in the ED with systolic blood pressure in the 70s.  She was given IV fluids and then started on dopamine for hypotension and bradycardia.  The patient was being transcutaneously paced and was very uncomfortable as a result.  She was given Versed.  Given severe bradycardia, I was called for transvenous pacemaker placement.  The patient was noted to be hypothermic with temperature of 85.9.  The daughter was at the bedside and I discussed with her the uncertainty of the diagnosis at this time but I felt that it was not practical to continue transcutaneous pacing with the level of discomfort that she was having and it would be best to place a temporary transvenous pacemaker at least until we identify the  cause of bradycardia, hypotension and hypothermia.  I also discussed with Dr. Derrill Kay in the ED as well as Dr. Rennis Harding care of critical care. The patient takes multiple antihypertensive medications at home including metoprolol 50 mg twice daily.   Past Medical History:  Diagnosis Date  . Asthma   . Hypertension     Past Surgical History:  Procedure Laterality Date  . BREAST BIOPSY Left 2013   neg     Home Medications:  Prior to Admission medications   Medication Sig Start Date End Date Taking? Authorizing Provider  allopurinol (ZYLOPRIM) 100 MG tablet Take 200 mg by mouth daily. 05/05/20   [provider]  amLODipine (NORVASC) 10 MG tablet Take 10 mg by mouth daily. 05/05/20   [provider]  aspirin 81 MG EC tablet Take 81 mg by mouth daily at 6 (six) AM.    [provider]  atorvastatin (LIPITOR) 20 MG tablet Take 20 mg by mouth daily. 07/16/20   [provider]  fluticasone (FLONASE) 50 MCG/ACT nasal spray Place 1 spray into both nostrils daily at 6 (six) AM. 07/04/20   [provider]  hydroxychloroquine (PLAQUENIL) 200 MG tablet Take 200 mg by mouth 2 (two) times daily. 05/05/20   [provider]  levocetirizine (XYZAL) 5 MG tablet Take 5 mg by mouth daily. 05/06/20   [provider]  losartan (COZAAR) 100 MG tablet Take 100 mg by mouth daily. 05/05/20   [provider]  Omega-3 Fatty Acids (FISH OIL) 1360 MG CAPS Take 2 capsules  by mouth daily at 6 (six) AM.    [provider]    Inpatient Medications: Scheduled Meds:  Continuous Infusions: . DOPamine 5 mcg/kg/min (2020-06-04 1626)   PRN Meds: Heparin (Porcine) in NaCl, lidocaine (PF)  Allergies:   Not on File  Social History:   Social History   Socioeconomic History  . Marital status: Divorced    Spouse name: Not on file  . Number of children: Not on file  . Years of education: Not on file  . Highest education level: Not on file   Occupational History  . Not on file  Tobacco Use  . Smoking status: Not on file  . Smokeless tobacco: Not on file  Substance and Sexual Activity  . Alcohol use: Not on file  . Drug use: Not on file  . Sexual activity: Not on file  Other Topics Concern  . Not on file  Social History Narrative  . Not on file   Social Determinants of Health   Financial Resource Strain: Not on file  Food Insecurity: Not on file  Transportation Needs: Not on file  Physical Activity: Not on file  Stress: Not on file  Social Connections: Not on file  Intimate Partner Violence: Not on file    Family History:    Family History  Problem Relation Age of Onset  . Breast cancer Neg Hx      ROS:  Not able to obtain due to minimal responsiveness.  Physical Exam/Data:   Vitals:   2020-06-04 1609 2020-06-04 1610 2020-06-04 1615 2020-06-04 1620  BP:  (!) 72/46 (!) 72/45   Pulse: (!) 59 (!) 59 (!) 58   Resp: (!) 26 (!) 26 17   Temp: (!) 85.9 F (29.9 C) (!) 85.9 F (29.9 C) (!) 85.9 F (29.9 C)   TempSrc:      SpO2: 97% 97% 98%   Weight:    72.6 kg  Height:    5\' 3"  (1.6 m)   No intake or output data in the 24 hours ending 2020-06-04 1722 Last 3 Weights 07/29/19  Weight (lbs) 160 lb  Weight (kg) 72.576 kg     Body mass index is 28.34 kg/m.  General: Minimally responsive although she does try to talk with few words when asked.  She became more alert in the Cath Lab. HEENT: normal Lymph: no adenopathy Neck: Jugular venous pressure is not well visualized. Endocrine:  No thryomegaly Vascular: No carotid bruits; FA pulses 2+ bilaterally without bruits  Cardiac:  normal S1, S2; RRR; no murmur  Lungs:  clear to auscultation bilaterally, no wheezing, rhonchi or rales  Abd: soft, nontender, no hepatomegaly  Ext: Mild bilateral leg edema Musculoskeletal:  No deformities, BUE and BLE strength normal and equal Skin: warm and dry  Neuro: Lethargic Psych not able to evaluate due to her mental  status.  EKG:  The EKG was personally reviewed and demonstrates: Paced rhythm but when the pacer was turned down the patient was noted to have junctional bradycardia with heart rate between 15 to 20 bpm.   Relevant CV Studies:   Laboratory Data:  High Sensitivity Troponin:  No results for input(s): TROPONINIHS in the last 720 hours.   Chemistry Recent Labs  Lab 2020-06-04 1600  NA 131*  K 5.5*  CL 98  CO2 20*  GLUCOSE 128*  BUN 56*  CREATININE 3.31*  CALCIUM 10.4*  GFRNONAA 14*  ANIONGAP 13    Recent Labs  Lab 2020-06-04 1600  PROT 6.2*  ALBUMIN 3.1*  AST 61*  ALT 56*  ALKPHOS 152*  BILITOT 0.5   Hematology Recent Labs  Lab 08-15-20 1600  WBC 11.4*  RBC 2.74*  HGB 8.6*  HCT 25.3*  MCV 92.3  MCH 31.4  MCHC 34.0  RDW 16.3*  PLT 142*   BNPNo results for input(s): BNP, PROBNP in the last 168 hours.  DDimer No results for input(s): DDIMER in the last 168 hours.   Radiology/Studies:  CARDIAC CATHETERIZATION  Result Date: 08-15-20 Successful transvenous pacemaker placement via the right common femoral vein. Recommendations: During pacemaker placement, the patient was noted to have some intrinsic rhythm with heart rate in the high 40s and low 50s.  Thus, I elected to start the pacemaker rate at 40 bpm to allow intrinsic rhythm.  The patient's hypotension improved with IV fluids and dopamine. Recommend admission to the intensive care unit for further work-up of hypothermia, anemia and acute renal failure. Continue to hold all antihypertensive medications including metoprolol which is the likely culprit for bradycardia in the setting of acute renal failure with subsequent accumulation. I suspect that the patient's most likely will not require permanent pacemaker.  Once we know that her bradycardia has resolved, the temporary pacemaker can be removed.  DG Chest Port 1 View  Result Date: 08/15/2020 CLINICAL DATA:  Questionable sepsis. EXAM: PORTABLE CHEST 1 VIEW  COMPARISON:  None. FINDINGS: The heart size is enlarged. Evaluation of the left lung field is limited by an overlying defibrillator pad. There appear to be diffuse hazy airspace opacities in the left lung field and to a lesser degree the right. Kerley B lines are noted. There are probable trace bilateral pleural effusions. There is no pneumothorax. There is an old left clavicle fracture. No definite acute displaced fracture. IMPRESSION: 1. Cardiomegaly with pulmonary edema. 2. Probable trace bilateral pleural effusions. 3. Hazy airspace opacities, left greater than right, may represent an atypical infectious process or developing pulmonary edema. Electronically Signed   By: Katherine Mantle M.D.   On: 08-15-20 16:14     Assessment and Plan:   1. Severe symptomatic bradycardia: This is likely due to underlying metabolic abnormalities as well as accumulation of metoprolol in the setting of acute renal failure.  I suspect that this is going to be a temporary process but nonetheless does require support with transvenous pacemaker.  I discussed this with the patient's daughter who was at the bedside and recommended proceeding with transvenous pacemaker placement.  I briefly discussed the risk and benefits and she was agreeable to proceed.  The daughter wanted her mom to be full code for now.  Transvenous temporary pacemaker was placed via the right common femoral vein without immediate complications.  As we were positioning the pacemaker, the patient was noted to have some intrinsic rhythm with heart rate in the high 40s and low 50s.  Due to that, I elected to set the pacemaker on standby at 40 bpm to allow her intrinsic rhythm to continue.  Once bradycardia resolves and she is not requiring intermittent pacing, the transvenous pacemaker can be removed. 2. Acute on chronic renal failure: Suspect likely prerenal given poor oral intake according to the family.  Continue IV fluids.  Continue to hold  antihypertensive medications including losartan. 3. Hypothermia: Rule out underlying sepsis.  Check CPK for rhabdomyolysis. 4. Hypotension: Hold antihypertensive medications.  Continue dopamine for now which is likely helping with her bradycardia and hypotension.  For questions or updates, please contact [facility] Please consult [email or web address]  for contact info under    Signed, Lorine Bears, MD  07/03/2020 5:22 PM

## 2020-07-18 NOTE — Progress Notes (Addendum)
Near 2200, pt's BP becoming soft. APP notified, Vaso gtt added and Dopamine titrated up to 20. 1 Amp Bicarb given. Pt remains on nasal canula, increased from 3 to 5 liters overnight. IV insulin and D50 given d/t K 5.5.    Pt began desat'ing this AM near 0200, Venturi mask applied, and then NRB back applied. ABG received, chest xray done, and BIPAP started @ 100%. APP @ bedside. HD trialysis placed, xray confirmed. 1 amp of Bicarb given.  2 amps of d50 given after 0400 blood sugar check due to LOW glucose level.    Pt remains drowsy, RAAS -2 to -3. Responds to voice, intermittently follows simple commands.   Purewick placed. Pt bladder scanned, 35 ml detected. APP aware, neph consult order placed. Foley catheter placed this AM.     This AM, Sodium phos and Doxycycline infused via IV.

## 2020-07-18 NOTE — ED Notes (Addendum)
Pt paced at 42ma 60bmp

## 2020-07-19 ENCOUNTER — Inpatient Hospital Stay (HOSPITAL_COMMUNITY)
Admit: 2020-07-19 | Discharge: 2020-07-19 | Disposition: A | Discharge status: Home / Self Care | Payer: Medicare Other | Attending: Critical Care Medicine | Admitting: Critical Care Medicine

## 2020-07-19 ENCOUNTER — Inpatient Hospital Stay: Payer: Medicare Other

## 2020-07-19 DIAGNOSIS — I361 Nonrheumatic tricuspid (valve) insufficiency: Secondary | ICD-10-CM

## 2020-07-19 DIAGNOSIS — I340 Nonrheumatic mitral (valve) insufficiency: Secondary | ICD-10-CM

## 2020-07-19 DIAGNOSIS — R4182 Altered mental status, unspecified: Secondary | ICD-10-CM | POA: Diagnosis not present

## 2020-07-19 DIAGNOSIS — R001 Bradycardia, unspecified: Secondary | ICD-10-CM | POA: Diagnosis not present

## 2020-07-19 DIAGNOSIS — R0603 Acute respiratory distress: Secondary | ICD-10-CM

## 2020-07-19 LAB — CBC WITH DIFFERENTIAL/PLATELET
Abs Immature Granulocytes: 0.07 10*3/uL (ref 0.00–0.07)
Basophils Absolute: 0 10*3/uL (ref 0.0–0.1)
Basophils Relative: 0 %
Eosinophils Absolute: 0 10*3/uL (ref 0.0–0.5)
Eosinophils Relative: 0 %
HCT: 26.6 % — ABNORMAL LOW (ref 36.0–46.0)
Hemoglobin: 8.8 g/dL — ABNORMAL LOW (ref 12.0–15.0)
Immature Granulocytes: 1 %
Lymphocytes Relative: 5 %
Lymphs Abs: 0.4 10*3/uL — ABNORMAL LOW (ref 0.7–4.0)
MCH: 31 pg (ref 26.0–34.0)
MCHC: 33.1 g/dL (ref 30.0–36.0)
MCV: 93.7 fL (ref 80.0–100.0)
Monocytes Absolute: 0.4 10*3/uL (ref 0.1–1.0)
Monocytes Relative: 5 %
Neutro Abs: 6.5 10*3/uL (ref 1.7–7.7)
Neutrophils Relative %: 89 %
Platelets: 152 10*3/uL (ref 150–400)
RBC: 2.84 MIL/uL — ABNORMAL LOW (ref 3.87–5.11)
RDW: 16.1 % — ABNORMAL HIGH (ref 11.5–15.5)
Smear Review: NORMAL
WBC: 7.3 10*3/uL (ref 4.0–10.5)
nRBC: 14.2 % — ABNORMAL HIGH (ref 0.0–0.2)

## 2020-07-19 LAB — URINALYSIS, COMPLETE (UACMP) WITH MICROSCOPIC
Bilirubin Urine: NEGATIVE
Glucose, UA: NEGATIVE mg/dL
Hgb urine dipstick: NEGATIVE
Ketones, ur: NEGATIVE mg/dL
Leukocytes,Ua: NEGATIVE
Nitrite: NEGATIVE
Protein, ur: 100 mg/dL — AB
Specific Gravity, Urine: 1.02 (ref 1.005–1.030)
pH: 5 (ref 5.0–8.0)

## 2020-07-19 LAB — BLOOD GAS, ARTERIAL
Acid-base deficit: 6 mmol/L — ABNORMAL HIGH (ref 0.0–2.0)
Acid-base deficit: 6.4 mmol/L — ABNORMAL HIGH (ref 0.0–2.0)
Bicarbonate: 18.5 mmol/L — ABNORMAL LOW (ref 20.0–28.0)
Bicarbonate: 17.6 mmol/L — ABNORMAL LOW (ref 20.0–28.0)
Delivery systems: POSITIVE
Expiratory PAP: 8
FIO2: 1
FIO2: 100
Inspiratory PAP: 12
O2 Saturation: 91.4 %
O2 Saturation: 90.7 %
Patient temperature: 37
Patient temperature: 37
pCO2 arterial: 32 mmHg (ref 32.0–48.0)
pCO2 arterial: 29 mmHg — ABNORMAL LOW (ref 32.0–48.0)
pH, Arterial: 7.37 (ref 7.350–7.450)
pH, Arterial: 7.39 (ref 7.350–7.450)
pO2, Arterial: 64 mmHg — ABNORMAL LOW (ref 83.0–108.0)
pO2, Arterial: 61 mmHg — ABNORMAL LOW (ref 83.0–108.0)

## 2020-07-19 LAB — RENAL FUNCTION PANEL
Albumin: 2.4 g/dL — ABNORMAL LOW (ref 3.5–5.0)
Anion gap: 11 (ref 5–15)
BUN: 57 mg/dL — ABNORMAL HIGH (ref 8–23)
CO2: 19 mmol/L — ABNORMAL LOW (ref 22–32)
Calcium: 8.9 mg/dL (ref 8.9–10.3)
Chloride: 102 mmol/L (ref 98–111)
Creatinine, Ser: 3.74 mg/dL — ABNORMAL HIGH (ref 0.44–1.00)
GFR, Estimated: 12 mL/min — ABNORMAL LOW (ref >60)
Glucose, Bld: 118 mg/dL — ABNORMAL HIGH (ref 70–99)
Phosphorus: 1.8 mg/dL — ABNORMAL LOW (ref 2.5–4.6)
Potassium: 4.4 mmol/L (ref 3.5–5.1)
Sodium: 132 mmol/L — ABNORMAL LOW (ref 135–145)

## 2020-07-19 LAB — ECHOCARDIOGRAM COMPLETE
AR max vel: 1.48 cm2
AV Area VTI: 1.63 cm2
AV Area mean vel: 1.7 cm2
AV Mean grad: 5 mmHg
AV Peak grad: 10.5 mmHg
Ao pk vel: 1.62 m/s
Area-P 1/2: 3.01 cm2
Height: 63 in
P 1/2 time: 600 msec
S' Lateral: 3.49 cm
Weight: 2560 oz

## 2020-07-19 LAB — CORTISOL-AM, BLOOD: Cortisol - AM: 61.5 ug/dL — ABNORMAL HIGH (ref 6.7–22.6)

## 2020-07-19 LAB — AMMONIA: Ammonia: 16 umol/L (ref 9–35)

## 2020-07-19 LAB — PROCALCITONIN
Procalcitonin: <0.1 ng/mL
Procalcitonin: 0.41 ng/mL

## 2020-07-19 LAB — GLUCOSE, CAPILLARY
Glucose-Capillary: 112 mg/dL — ABNORMAL HIGH (ref 70–99)
Glucose-Capillary: 114 mg/dL — ABNORMAL HIGH (ref 70–99)
Glucose-Capillary: 116 mg/dL — ABNORMAL HIGH (ref 70–99)
Glucose-Capillary: 119 mg/dL — ABNORMAL HIGH (ref 70–99)
Glucose-Capillary: 121 mg/dL — ABNORMAL HIGH (ref 70–99)
Glucose-Capillary: 136 mg/dL — ABNORMAL HIGH (ref 70–99)
Glucose-Capillary: 60 mg/dL — ABNORMAL LOW (ref 70–99)
Glucose-Capillary: <10 mg/dL — CL (ref 70–99)

## 2020-07-19 LAB — POTASSIUM
Potassium: 5.9 mmol/L — ABNORMAL HIGH (ref 3.5–5.1)
Potassium: 5.7 mmol/L — ABNORMAL HIGH (ref 3.5–5.1)

## 2020-07-19 LAB — BASIC METABOLIC PANEL
Anion gap: 13 (ref 5–15)
BUN: 58 mg/dL — ABNORMAL HIGH (ref 8–23)
CO2: 19 mmol/L — ABNORMAL LOW (ref 22–32)
Calcium: 9.2 mg/dL (ref 8.9–10.3)
Chloride: 103 mmol/L (ref 98–111)
Creatinine, Ser: 3.75 mg/dL — ABNORMAL HIGH (ref 0.44–1.00)
GFR, Estimated: 12 mL/min — ABNORMAL LOW (ref >60)
Glucose, Bld: 119 mg/dL — ABNORMAL HIGH (ref 70–99)
Potassium: 6.3 mmol/L (ref 3.5–5.1)
Sodium: 135 mmol/L (ref 135–145)

## 2020-07-19 LAB — MAGNESIUM: Magnesium: 1.9 mg/dL (ref 1.7–2.4)

## 2020-07-19 LAB — PHOSPHORUS: Phosphorus: 4.8 mg/dL — ABNORMAL HIGH (ref 2.5–4.6)

## 2020-07-19 MED ORDER — DEXTROSE 50 % IV SOLN
1.0000 | INTRAVENOUS | Status: AC
  Administered 2020-07-19: 50 mL via INTRAVENOUS

## 2020-07-19 MED ORDER — DEXTROSE 50 % IV SOLN
INTRAVENOUS | Status: AC
  Filled 2020-07-19: qty 100

## 2020-07-19 MED ORDER — MORPHINE SULFATE (PF) 2 MG/ML IV SOLN
2.0000 mg | Freq: Once | INTRAVENOUS | Status: AC
Start: 2020-07-20 — End: 2020-07-19
  Administered 2020-07-19: 23:00:00 2 mg via INTRAVENOUS
  Filled 2020-07-19: qty 1

## 2020-07-19 MED ORDER — SODIUM BICARBONATE 8.4 % IV SOLN
50.0000 meq | Freq: Once | INTRAVENOUS | Status: AC
  Administered 2020-07-19: 04:00:00 50 meq via INTRAVENOUS
  Filled 2020-07-19: qty 50

## 2020-07-19 MED ORDER — SODIUM CHLORIDE 0.9 % IV SOLN
100.0000 mg | Freq: Two times a day (BID) | INTRAVENOUS | Status: DC
  Administered 2020-07-19 – 2020-07-20 (×3): 100 mg via INTRAVENOUS
  Filled 2020-07-19 (×4): qty 100

## 2020-07-19 MED ORDER — FENTANYL CITRATE (PF) 100 MCG/2ML IJ SOLN
12.5000 ug | Freq: Once | INTRAMUSCULAR | Status: AC
Start: 2020-07-19 — End: 2020-07-19
  Administered 2020-07-19: 23:00:00 12.5 ug via INTRAVENOUS
  Filled 2020-07-19: qty 2

## 2020-07-19 MED ORDER — HEPARIN SODIUM (PORCINE) 1000 UNIT/ML IJ SOLN
1000.0000 [IU] | INTRAMUSCULAR | Status: DC | PRN
  Filled 2020-07-19: qty 6

## 2020-07-19 MED ORDER — INSULIN ASPART 100 UNIT/ML IV SOLN
10.0000 [IU] | Freq: Once | INTRAVENOUS | Status: AC
  Administered 2020-07-19: 01:00:00 10 [IU] via INTRAVENOUS
  Filled 2020-07-19: qty 0.1

## 2020-07-19 MED ORDER — DEXTROSE 10 % IV SOLN: INTRAVENOUS | Status: DC

## 2020-07-19 MED ORDER — SODIUM BICARBONATE 8.4 % IV SOLN
100.0000 meq | Freq: Once | INTRAVENOUS | Status: AC
  Administered 2020-07-19: 22:00:00 100 meq via INTRAVENOUS
  Filled 2020-07-19: qty 100

## 2020-07-19 MED ORDER — DEXTROSE IN LACTATED RINGERS 5 % IV SOLN: INTRAVENOUS | Status: DC

## 2020-07-19 MED ORDER — DEXTROSE 50 % IV SOLN
1.0000 | Freq: Once | INTRAVENOUS | Status: AC
  Administered 2020-07-19: 01:00:00 50 mL via INTRAVENOUS
  Filled 2020-07-19: qty 50

## 2020-07-19 MED ORDER — NOREPINEPHRINE 16 MG/250ML-% IV SOLN
0.0000 ug/min | INTRAVENOUS | Status: DC
  Filled 2020-07-19: qty 250

## 2020-07-19 MED ORDER — SODIUM POLYSTYRENE SULFONATE 15 GM/60ML PO SUSP
60.0000 g | Freq: Once | ORAL | Status: AC
  Administered 2020-07-19: 21:00:00 60 g via RECTAL
  Filled 2020-07-19: qty 240

## 2020-07-19 MED ORDER — SODIUM BICARBONATE 8.4 % IV SOLN
INTRAVENOUS | Status: AC
  Filled 2020-07-19: qty 150

## 2020-07-19 MED ORDER — SODIUM PHOSPHATES 45 MMOLE/15ML IV SOLN
20.0000 mmol | Freq: Once | INTRAVENOUS | Status: AC
  Administered 2020-07-19: 06:00:00 20 mmol via INTRAVENOUS
  Filled 2020-07-19: qty 6.67

## 2020-07-19 NOTE — Progress Notes (Addendum)
Progress Note  Patient Name: Julie Roberts Date of Encounter: 07/19/2020  [facility] Cardiologist: Kirke Corin    Subjective   74 year old female who was admitted to the hospital yesterday with altered mental status.  She was found to have a heart rate of 50-20.  She was hypothermic.  Transvenous pacemaker was placed by Dr. Kirke Corin.  She has acute kidney injury.   Currently she has a junctional rhythm in the 50s .   While I was seeing her she had NSR in 60-62 range.  Normal PR interval  Is not needing the pacer     Inpatient Medications    Scheduled Meds: . Chlorhexidine Gluconate Cloth  6 each Topical Q0600  . dextrose       Continuous Infusions: . dextrose 30 mL/hr at 07/19/20 0943  . DOPamine 20 mcg/kg/min (07/19/20 0625)  . doxycycline (VIBRAMYCIN) IV 100 mg (07/19/20 0522)  . norepinephrine (LEVOPHED) Adult infusion Stopped (07/19/20 0557)  . sodium phosphate  Dextrose 5% IVPB 20 mmol (07/19/20 0622)  . vasopressin 0.04 Units/min (07/19/20 0531)   PRN Meds: heparin sodium (porcine)   Vital Signs    Vitals:   07/19/20 0630 07/19/20 0700 07/19/20 0734 07/19/20 0800  BP: (!) 105/50 (!) 109/44  (!) 119/52  Pulse:  (!) 45 (!) 50 (!) 49  Resp: 17 18  18   Temp: (!) 97.16 F (36.2 C) (!) 96.98 F (36.1 C)  (!) 97.34 F (36.3 C)  TempSrc:  Rectal    SpO2:  91% 91% 95%  Weight:      Height:        Intake/Output Summary (Last 24 hours) at 07/19/2020 1000 Last data filed at 07/19/2020 0522 Gross per 24 hour  Intake --  Output 40 ml  Net -40 ml   Last 3 Weights Jul 29, 2020  Weight (lbs) 160 lb  Weight (kg) 72.576 kg      Telemetry    Junctional escape in 50s  - which improved to NSR at 60 with normal PR interval  Personally Reviewed  ECG     - Personally Reviewed  Physical Exam  Physical Exam: Blood pressure (!) 119/52, pulse (!) 49, temperature (!) 97.34 F (36.3 C), resp. rate 18, height 5\' 3"  (1.6 m), weight 72.6 kg, SpO2 95 %.  GEN:   Elderly female,  No responsive to questions.  Moves in response to me touching her / examining her ,  Warming blanket on  HEENT: Normal NECK: No JVD; No carotid bruits LYMPHATICS: No lymphadenopathy CARDIAC: RRR    Brady  RESPIRATORY:  Clear to auscultation without rales, wheezing or rhonchi  ABDOMEN: Soft, non-tender, non-distended MUSCULOSKELETAL:  No edema; No deformity . Transvenous pacer in RFV.   SKIN: Warm and dry NEUROLOGIC:  Alert and oriented x 3   Labs    High Sensitivity Troponin:  No results for input(s): TROPONINIHS in the last 720 hours.    Chemistry Recent Labs  Lab 29-Jul-2020 1600 07-29-2020 2319 07/19/20 0426  NA 131* 133* 132*  K 5.5* 5.5* 4.4  CL 98 103 102  CO2 20* 20* 19*  GLUCOSE 128* 121* 118*  BUN 56* 55* 57*  CREATININE 3.31* 3.49* 3.74*  CALCIUM 10.4* 9.7 8.9  PROT 6.2*  --   --   ALBUMIN 3.1*  --  2.4*  AST 61*  --   --   ALT 56*  --   --   ALKPHOS 152*  --   --   BILITOT 0.5  --   --  GFRNONAA 14* 13* 12*  ANIONGAP 13 10 11      Hematology Recent Labs  Lab 07/10/2020 1600 07/19/20 0426  WBC 11.4* 7.3  RBC 2.74* 2.84*  HGB 8.6* 8.8*  HCT 25.3* 26.6*  MCV 92.3 93.7  MCH 31.4 31.0  MCHC 34.0 33.1  RDW 16.3* 16.1*  PLT 142* 152    BNPNo results for input(s): BNP, PROBNP in the last 168 hours.   DDimer No results for input(s): DDIMER in the last 168 hours.   Radiology    CARDIAC CATHETERIZATION  Result Date: 06/28/2020 Successful transvenous pacemaker placement via the right common femoral vein. Recommendations: During pacemaker placement, the patient was noted to have some intrinsic rhythm with heart rate in the high 40s and low 50s.  Thus, I elected to start the pacemaker rate at 40 bpm to allow intrinsic rhythm.  The patient's hypotension improved with IV fluids and dopamine. Recommend admission to the intensive care unit for further work-up of hypothermia, anemia and acute renal failure. Continue to hold all antihypertensive  medications including metoprolol which is the likely culprit for bradycardia in the setting of acute renal failure with subsequent accumulation. I suspect that the patient's most likely will not require permanent pacemaker.  Once we know that her bradycardia has resolved, the temporary pacemaker can be removed.  DG Chest Port 1 View  Result Date: 07/19/2020 CLINICAL DATA:  Central line placement EXAM: PORTABLE CHEST 1 VIEW COMPARISON:  Earlier today FINDINGS: New dialysis catheter on the right with tip at the upper cavoatrial junction. No pneumothorax. Asymmetric extensive airspace disease on the right more than left. No visible pleural effusion. Cardiomegaly. There is a temporary pacer from below which has a more vertical position today. Remote mid left clavicle fracture. IMPRESSION: 1. New dialysis catheter without complicating feature. 2. Temporary pacer from below with more vertical positioning of the distal lead, but still likely overlapping the right ventricle. 3. Stable airspace disease. Electronically Signed   By: 07/21/2020 M.D.   On: 07/19/2020 05:54   DG Chest Port 1 View  Result Date: 07/19/2020 CLINICAL DATA:  Respiratory failure. EXAM: PORTABLE CHEST 1 VIEW COMPARISON:  07/13/2020 FINDINGS: 0350 hours. The cardio pericardial silhouette is enlarged. Bilateral airspace disease, right greater than left, is similar to prior although progressive in the right base. No pleural effusion. Old left clavicle fracture noted. Telemetry leads overlie the chest. IMPRESSION: Asymmetric right greater than left airspace disease, similar to prior although it is progressive at the right base. Electronically Signed   By: 07/20/2020 M.D.   On: 07/19/2020 04:03   DG Chest Port 1 View  Result Date: 07/19/2020 CLINICAL DATA:  74 year old female status post pacemaker placement. EXAM: PORTABLE CHEST 1 VIEW COMPARISON:  Chest radiograph dated 07/13/2020. FINDINGS: There is cardiomegaly with vascular  congestion and probable mild edema. Patchy area of density in the left mid lung field may represent edema or pneumonia. No pleural effusion pneumothorax. No acute osseous pathology. A pacer wire noted with tip over the spine. IMPRESSION: Mild cardiomegaly and mild vascular congestion. Patchy density in the left mid lung field may represent edema or pneumonia. No pneumothorax. Electronically Signed   By: 07/20/2020 M.D.   On: 06/28/2020 18:06   DG Chest Port 1 View  Result Date: 06/27/2020 CLINICAL DATA:  Questionable sepsis. EXAM: PORTABLE CHEST 1 VIEW COMPARISON:  None. FINDINGS: The heart size is enlarged. Evaluation of the left lung field is limited by an overlying defibrillator pad. There appear to  be diffuse hazy airspace opacities in the left lung field and to a lesser degree the right. Kerley B lines are noted. There are probable trace bilateral pleural effusions. There is no pneumothorax. There is an old left clavicle fracture. No definite acute displaced fracture. IMPRESSION: 1. Cardiomegaly with pulmonary edema. 2. Probable trace bilateral pleural effusions. 3. Hazy airspace opacities, left greater than right, may represent an atypical infectious process or developing pulmonary edema. Electronically Signed   By: Katherine Mantle M.D.   On: 07/08/2020 16:14    Cardiac Studies      Patient Profile     74 y.o. femalewith altered mental status, bradycardia , hypothermia   Assessment & Plan    1.   Bradycardia. :  She currently has a junctional escape.  She is not requiring the transvenous pacemaker.  We will pull the transvenous pacemaker and the right femoral vein sheath. Continue dopamine for support.   I have pulled the transvenous pacer from RFV sheath. The sheath is in place and may be removed by nurse if the critical care team does not need the IV site.   2.  Altered mental status: No clear etiology at this point.  She has been rewarmed.  Her heart rate is in the 50s so  that should not be contributing to her altered mental status. Further work-up per the critical care team. She has not had a head  CT scan yet.        For questions or updates, please contact [facility] Please consult [email or web address] for contact info under        Signed, Kristeen Miss, MD  07/19/2020, 10:00 AM

## 2020-07-19 NOTE — Procedures (Signed)
Central Venous Catheter Insertion Procedure Note  Julie Roberts  [id number]  1946/03/10  Date:07/19/20  Time:5:08 AM   Provider Performing:[facility]is [facility]   Procedure: Insertion of Non-tunneled Central Venous Catheter(36556)with US guidance (19758)    Indication(s) Hemodialysis and to administer medications  Consent Risks of the procedure as well as the alternatives and risks of each were explained to the patient and/or caregiver.  Consent for the procedure was obtained and is signed in the bedside chart  Anesthesia Topical only with 1% lidocaine   Timeout Verified patient identification, verified procedure, site/side was marked, verified correct patient position, special equipment/implants available, medications/allergies/relevant history reviewed, required imaging and test results available.  Sterile Technique Maximal sterile technique including full sterile barrier drape, hand hygiene, sterile gown, sterile gloves, mask, hair covering, sterile ultrasound probe cover (if used).  Procedure Description Area of catheter insertion was cleaned with chlorhexidine and draped in sterile fashion.   With real-time ultrasound guidance a HD catheter was placed into the right internal jugular vein.  Nonpulsatile blood flow and easy flushing noted in all ports.  The catheter was sutured in place and sterile dressing applied.  Complications/Tolerance None; patient tolerated the procedure well. Chest X-ray is ordered to verify placement for internal jugular or subclavian cannulation.  Chest x-ray is not ordered for femoral cannulation.  EBL Minimal  Specimen(s) None  Sonda Rumble, AGNP  Pulmonary/Critical Care Pager [phone] (please enter 7 digits) PCCM Consult Pager [phone] (please enter 7 digits)

## 2020-07-19 NOTE — Progress Notes (Signed)
Updated pts daughter Melvia Heaps regarding pts worsening renal function with poor urinary output and pt developing worsening acute hypoxic respiratory failure secondary to pulmonary edema.  I informed Mrs. Harris a nephrology consult has been order and pt may need dialysis.  I obtained consent for placement of temporary dialysis catheter.   All questions were answered will continue to monitor and assess pt.  Sonda Rumble, AGNP  Pulmonary/Critical Care Pager [phone] (please enter 7 digits) PCCM Consult Pager [phone] (please enter 7 digits)

## 2020-07-19 NOTE — Progress Notes (Signed)
PHARMACY CONSULT NOTE - FOLLOW UP  Pharmacy Consult for Electrolyte Monitoring and Replacement   Recent Labs: Potassium (mmol/L)  Date Value  07/19/2020 4.4   Magnesium (mg/dL)  Date Value  07/18/4974 1.9   Calcium (mg/dL)  Date Value  30/11/1100 8.9   Albumin (g/dL)  Date Value  06/11/3566 2.4 (L)   Phosphorus (mg/dL)  Date Value  01/41/0301 1.8 (L)   Sodium (mmol/L)  Date Value  07/19/2020 132 (L)   Assessment: D/W Annabelle Harman NP to order Na Phos.  Labs as noted above.  Goal of Therapy:  Replace to WNL  Plan:  Na Phos IV x 1 over 6 hours F/U labs after infusion completed  Wayland Denis ,PharmD Clinical Pharmacist 07/19/2020 5:38 AM

## 2020-07-19 NOTE — Consult Note (Signed)
Reason for Consult: AMS Requesting Physician: Dr. Karna Christmas  CC: AMS   HPI: Girl Schissler is an 74 y.o. female presents with confusion and encephalopathy.  Daughter states despite significant comorbid history she was able to drive 1 month ago and does all ADLs on her own.  She has been unsteady on feet with disequilibrium over past 2 months and she has had MRI brian for possible CVA on outpatient which showed no acute abnormalities as per pt's mother.  She does state that progressive deterioration for past 3 weeks to 1 month. She has been having difficulty ambulating, forgetfulness.  On presentation pt was found to have bradycardia in the teens.She had transcutaneous pacing placed. Pt was also found to have AKI on CKD  Past Medical History:  Diagnosis Date  . Asthma   . Hypertension     Past Surgical History:  Procedure Laterality Date  . BREAST BIOPSY Left 2013   neg    Family History  Problem Relation Age of Onset  . Breast cancer Neg Hx     Social History:  has no history on file for tobacco use, alcohol use, and drug use.  Allergies  Allergen Reactions  . Penicillins Hives, Palpitations and Swelling    Pt states has swelling, hives, and palpitations when has any penicillins.   Pt states has swelling, hives, and palpitations when has any penicillins.   Pt states has swelling, hives, and palpitations when has any penicillins.     . Ace Inhibitors Other (See Comments)    Cough Cough     Medications: I have reviewed the patient's current medications.  ROS: Unable to obtain due to disorientation   Physical Examination: Blood pressure (!) 119/52, pulse (!) 49, temperature (!) 97.34 F (36.3 C), resp. rate 18, height 5\' 3"  (1.6 m), weight 72.6 kg, SpO2 95 %.  Pt is not following commands but moving all her extremities Generalized weakness but no clear focality on examination  EOM appear to be intact  Pt is on bipap  Laboratory Studies:   Basic Metabolic  Panel: Recent Labs  Lab 08/13/20 1600 08-13-20 2319 07/19/20 0426  NA 131* 133* 132*  K 5.5* 5.5* 4.4  CL 98 103 102  CO2 20* 20* 19*  GLUCOSE 128* 121* 118*  BUN 56* 55* 57*  CREATININE 3.31* 3.49* 3.74*  CALCIUM 10.4* 9.7 8.9  MG  --   --  1.9  PHOS  --   --  1.8*    Liver Function Tests: Recent Labs  Lab 08/13/2020 1600 07/19/20 0426  AST 61*  --   ALT 56*  --   ALKPHOS 152*  --   BILITOT 0.5  --   PROT 6.2*  --   ALBUMIN 3.1* 2.4*   No results for input(s): LIPASE, AMYLASE in the last 168 hours. No results for input(s): AMMONIA in the last 168 hours.  CBC: Recent Labs  Lab 08/13/20 1600 07/19/20 0426  WBC 11.4* 7.3  NEUTROABS 8.5* 6.5  HGB 8.6* 8.8*  HCT 25.3* 26.6*  MCV 92.3 93.7  PLT 142* 152    Cardiac Enzymes: No results for input(s): CKTOTAL, CKMB, CKMBINDEX, TROPONINI in the last 168 hours.  BNP: Invalid input(s): POCBNP  CBG: Recent Labs  Lab 07/19/20 0346 07/19/20 0416 07/19/20 0744 07/19/20 1047 07/19/20 1223  GLUCAP <10* 136* 60* 112* 114*    Microbiology: Results for orders placed or performed during the hospital encounter of 08-13-20  Blood culture (routine single)     Status:  None (Preliminary result)   Collection Time: 2020/08/03  3:37 PM   Specimen: BLOOD  Result Value Ref Range Status   Specimen Description BLOOD BLOOD LEFT HAND  Final   Special Requests   Final    BOTTLES DRAWN AEROBIC AND ANAEROBIC Blood Culture results may not be optimal due to an inadequate volume of blood received in culture bottles   Culture   Final    NO GROWTH < 24 HOURS Performed at [facility], [address], [place], [place] [address]    Report Status PENDING  Incomplete  Resp Panel by RT-PCR (Flu A&B, Covid) Nasopharyngeal Swab     Status: None   Collection Time: 08-03-20  4:00 PM   Specimen: Nasopharyngeal Swab; Nasopharyngeal(NP) swabs in vial transport medium  Result Value Ref Range Status   SARS Coronavirus 2 by RT PCR  NEGATIVE NEGATIVE Final    Comment: (NOTE) SARS-CoV-2 target nucleic acids are NOT DETECTED.  The SARS-CoV-2 RNA is generally detectable in upper respiratory specimens during the acute phase of infection. The lowest concentration of SARS-CoV-2 viral copies this assay can detect is 138 copies/mL. A negative result does not preclude SARS-Cov-2 infection and should not be used as the sole basis for treatment or other patient management decisions. A negative result may occur with  improper specimen collection/handling, submission of specimen other than nasopharyngeal swab, presence of viral mutation(s) within the areas targeted by this assay, and inadequate number of viral copies(<138 copies/mL). A negative result must be combined with clinical observations, patient history, and epidemiological information. The expected result is Negative.  Fact Sheet for Patients:  BloggerCourse.com  Fact Sheet for Healthcare Providers:  SeriousBroker.it  This test is no t yet approved or cleared by the [place] FDA and  has been authorized for detection and/or diagnosis of SARS-CoV-2 by FDA under an Emergency Use Authorization (EUA). This EUA will remain  in effect (meaning this test can be used) for the duration of the COVID-19 declaration under Section 564(b)(1) of the Act, 21 U.S.C.section 360bbb-3(b)(1), unless the authorization is terminated  or revoked sooner.       Influenza A by PCR NEGATIVE NEGATIVE Final   Influenza B by PCR NEGATIVE NEGATIVE Final    Comment: (NOTE) The Xpert Xpress SARS-CoV-2/FLU/RSV plus assay is intended as an aid in the diagnosis of influenza from Nasopharyngeal swab specimens and should not be used as a sole basis for treatment. Nasal washings and aspirates are unacceptable for Xpert Xpress SARS-CoV-2/FLU/RSV testing.  Fact Sheet for Patients: BloggerCourse.com  Fact Sheet for  Healthcare Providers: SeriousBroker.it  This test is not yet approved or cleared by the [place] FDA and has been authorized for detection and/or diagnosis of SARS-CoV-2 by FDA under an Emergency Use Authorization (EUA). This EUA will remain in effect (meaning this test can be used) for the duration of the COVID-19 declaration under Section 564(b)(1) of the Act, 21 U.S.C. section 360bbb-3(b)(1), unless the authorization is terminated or revoked.  Performed at [facility], [address] [place], [place] [address]   MRSA PCR Screening     Status: None   Collection Time: 2020/08/03  5:47 PM   Specimen: Nasopharyngeal  Result Value Ref Range Status   MRSA by PCR NEGATIVE NEGATIVE Final    Comment:        The GeneXpert MRSA Assay (FDA approved for NASAL specimens only), is one component of a comprehensive MRSA colonization surveillance program. It is not intended to diagnose MRSA infection nor  to guide or monitor treatment for MRSA infections. Performed at [facility], [address] [place], [place] [address]     Coagulation Studies: Recent Labs    2020/07/28 1600  LABPROT 12.6  INR 1.0    Urinalysis:  Recent Labs  Lab 07/19/20 0410  COLORURINE AMBER*  LABSPEC 1.020  PHURINE 5.0  GLUCOSEU NEGATIVE  HGBUR NEGATIVE  BILIRUBINUR NEGATIVE  KETONESUR NEGATIVE  PROTEINUR 100*  NITRITE NEGATIVE  LEUKOCYTESUR NEGATIVE    Lipid Panel:  No results found for: CHOL, TRIG, HDL, CHOLHDL, VLDL, LDLCALC  HgbA1C: No results found for: HGBA1C  Urine Drug Screen:  No results found for: LABOPIA, COCAINSCRNUR, LABBENZ, AMPHETMU, THCU, LABBARB  Alcohol Level: No results for input(s): ETH in the last 168 hours.  Other results: EKG: normal EKG, normal sinus rhythm, unchanged from previous tracings.  Imaging: CARDIAC CATHETERIZATION  Result Date: 2020-07-28 Successful transvenous pacemaker placement via the right common  femoral vein. Recommendations: During pacemaker placement, the patient was noted to have some intrinsic rhythm with heart rate in the high 40s and low 50s.  Thus, I elected to start the pacemaker rate at 40 bpm to allow intrinsic rhythm.  The patient's hypotension improved with IV fluids and dopamine. Recommend admission to the intensive care unit for further work-up of hypothermia, anemia and acute renal failure. Continue to hold all antihypertensive medications including metoprolol which is the likely culprit for bradycardia in the setting of acute renal failure with subsequent accumulation. I suspect that the patient's most likely will not require permanent pacemaker.  Once we know that her bradycardia has resolved, the temporary pacemaker can be removed.  DG Chest Port 1 View  Result Date: 07/19/2020 CLINICAL DATA:  Central line placement EXAM: PORTABLE CHEST 1 VIEW COMPARISON:  Earlier today FINDINGS: New dialysis catheter on the right with tip at the upper cavoatrial junction. No pneumothorax. Asymmetric extensive airspace disease on the right more than left. No visible pleural effusion. Cardiomegaly. There is a temporary pacer from below which has a more vertical position today. Remote mid left clavicle fracture. IMPRESSION: 1. New dialysis catheter without complicating feature. 2. Temporary pacer from below with more vertical positioning of the distal lead, but still likely overlapping the right ventricle. 3. Stable airspace disease. Electronically Signed   By: Marnee Spring M.D.   On: 07/19/2020 05:54   DG Chest Port 1 View  Result Date: 07/19/2020 CLINICAL DATA:  Respiratory failure. EXAM: PORTABLE CHEST 1 VIEW COMPARISON:  07/28/2020 FINDINGS: 0350 hours. The cardio pericardial silhouette is enlarged. Bilateral airspace disease, right greater than left, is similar to prior although progressive in the right base. No pleural effusion. Old left clavicle fracture noted. Telemetry leads overlie the  chest. IMPRESSION: Asymmetric right greater than left airspace disease, similar to prior although it is progressive at the right base. Electronically Signed   By: Kennith Center M.D.   On: 07/19/2020 04:03   DG Chest Port 1 View  Result Date: July 28, 2020 CLINICAL DATA:  74 year old female status post pacemaker placement. EXAM: PORTABLE CHEST 1 VIEW COMPARISON:  Chest radiograph dated 07-28-20. FINDINGS: There is cardiomegaly with vascular congestion and probable mild edema. Patchy area of density in the left mid lung field may represent edema or pneumonia. No pleural effusion pneumothorax. No acute osseous pathology. A pacer wire noted with tip over the spine. IMPRESSION: Mild cardiomegaly and mild vascular congestion. Patchy density in the left mid lung field may represent edema or pneumonia. No pneumothorax. Electronically Signed   By:  Elgie Collard M.D.   On: 07-25-20 18:06   DG Chest Port 1 View  Result Date: July 25, 2020 CLINICAL DATA:  Questionable sepsis. EXAM: PORTABLE CHEST 1 VIEW COMPARISON:  None. FINDINGS: The heart size is enlarged. Evaluation of the left lung field is limited by an overlying defibrillator pad. There appear to be diffuse hazy airspace opacities in the left lung field and to a lesser degree the right. Kerley B lines are noted. There are probable trace bilateral pleural effusions. There is no pneumothorax. There is an old left clavicle fracture. No definite acute displaced fracture. IMPRESSION: 1. Cardiomegaly with pulmonary edema. 2. Probable trace bilateral pleural effusions. 3. Hazy airspace opacities, left greater than right, may represent an atypical infectious process or developing pulmonary edema. Electronically Signed   By: Katherine Mantle M.D.   On: 2020-07-25 16:14   ECHOCARDIOGRAM COMPLETE  Result Date: 07/19/2020    ECHOCARDIOGRAM REPORT   Patient Name:   GHAZAL PEVEY Date of Exam: 07/19/2020 Medical Rec #:  [id number]       Height:       63.0 in  Accession #:    [id number]      Weight:       160.0 lb Date of Birth:  1945-11-25        BSA:          1.759 m Patient Age:    74 years        BP:           104/42 mmHg Patient Gender: F               HR:           49 bpm. Exam Location:  [facility] Procedure: 2D Echo Indications:     Acute Respiratory Distress  History:         Patient has no prior history of Echocardiogram examinations.                  Stroke; Risk Factors:Hypertension.  Sonographer:     L Thornton-Maynard Referring Phys:  [id number] Eugenie Norrie Diagnosing Phys: Kristeen Miss MD IMPRESSIONS  1. Left ventricular ejection fraction, by estimation, is 55 to 60%. The left ventricle has normal function. The left ventricle has no regional wall motion abnormalities. Left ventricular diastolic parameters are indeterminate.  2. Right ventricular systolic function is normal. The right ventricular size is normal. There is moderately elevated pulmonary artery systolic pressure.  3. The mitral valve is normal in structure. Mild mitral valve regurgitation.  4. Tricuspid valve regurgitation is moderate.  5. The aortic valve is normal in structure. Aortic valve regurgitation is not visualized. FINDINGS  Left Ventricle: Left ventricular ejection fraction, by estimation, is 55 to 60%. The left ventricle has normal function. The left ventricle has no regional wall motion abnormalities. The left ventricular internal cavity size was normal in size. There is  no left ventricular hypertrophy. Left ventricular diastolic parameters are indeterminate. Right Ventricle: The right ventricular size is normal. No increase in right ventricular wall thickness. Right ventricular systolic function is normal. There is moderately elevated pulmonary artery systolic pressure. The tricuspid regurgitant velocity is 2.82 m/s, and with an assumed right atrial pressure of 15 mmHg, the estimated right ventricular systolic pressure is 46.8 mmHg. Left Atrium: Left atrial size was normal in size.  Right Atrium: Right atrial size was normal in size. Pericardium: There is no evidence of pericardial effusion. Mitral Valve: The mitral valve is normal in structure. Mild mitral  valve regurgitation. Tricuspid Valve: The tricuspid valve is grossly normal. Tricuspid valve regurgitation is moderate. Aortic Valve: The aortic valve is normal in structure. Aortic valve regurgitation is not visualized. Aortic regurgitation PHT measures 600 msec. Aortic valve mean gradient measures 5.0 mmHg. Aortic valve peak gradient measures 10.5 mmHg. Aortic valve area, by VTI measures 1.63 cm. Pulmonic Valve: The pulmonic valve was not well visualized. Pulmonic valve regurgitation is not visualized. Aorta: The aortic root and ascending aorta are structurally normal, with no evidence of dilitation. IAS/Shunts: The atrial septum is grossly normal.  LEFT VENTRICLE PLAX 2D LVIDd:         4.20 cm  Diastology LVIDs:         3.49 cm  LV e' medial:    8.49 cm/s LV PW:         0.67 cm  LV E/e' medial:  10.3 LV IVS:        0.59 cm  LV e' lateral:   10.40 cm/s LVOT diam:     2.00 cm  LV E/e' lateral: 8.4 LV SV:         48 LV SV Index:   28 LVOT Area:     3.14 cm  RIGHT VENTRICLE RV S prime:     9.90 cm/s TAPSE (M-mode): 2.0 cm LEFT ATRIUM             Index LA diam:        2.70 cm 1.54 cm/m LA Vol (A2C):   49.9 ml 28.37 ml/m LA Vol (A4C):   30.8 ml 17.51 ml/m LA Biplane Vol: 39.0 ml 22.18 ml/m  AORTIC VALVE AV Area (Vmax):    1.48 cm AV Area (Vmean):   1.70 cm AV Area (VTI):     1.63 cm AV Vmax:           162.00 cm/s AV Vmean:          101.000 cm/s AV VTI:            0.296 m AV Peak Grad:      10.5 mmHg AV Mean Grad:      5.0 mmHg LVOT Vmax:         76.10 cm/s LVOT Vmean:        54.800 cm/s LVOT VTI:          0.154 m LVOT/AV VTI ratio: 0.52 AI PHT:            600 msec  AORTA Ao Root diam: 2.40 cm MITRAL VALVE               TRICUSPID VALVE MV Area (PHT): 3.01 cm    TR Peak grad:   31.8 mmHg MV E velocity: 87.55 cm/s  TR Vmax:        282.00  cm/s MV A velocity: 55.80 cm/s MV E/A ratio:  1.57        SHUNTS                            Systemic VTI:  0.15 m                            Systemic Diam: 2.00 cm Kristeen Miss MD Electronically signed by Kristeen Miss MD Signature Date/Time: 07/19/2020/2:05:19 PM    Final      Assessment/Plan: 74 y.o. female presents with confusion and encephalopathy.  Daughter states despite significant comorbid history she was  able to drive 1 month ago and does all ADLs on her own.  She has been unsteady on feet with disequilibrium over past 2 months and she has had MRI brian for possible CVA on outpatient which showed no acute abnormalities as per pt's mother.  She does state that progressive deterioration for past 3 weeks to 1 month. She has been having difficulty ambulating, forgetfulness.  On presentation pt was found to have bradycardia in the teens.She had transcutaneous pacing placed. Pt was also found to have AKI on CKD   - MRI brain with chronic white matter changes no acute abnormalities.  - Mentation is mostly metabolic related in setting of bradycardia, AKi - At same time she has been having progressive deterioration. Unclear of her current baseline - After this hospital stay she will require neurocognitive testing and be followed every few months to determine if this is reversible condition or progressive dementia.   - d/w daughter at bedside.  07/19/2020, 2:05 PM

## 2020-07-19 NOTE — Progress Notes (Signed)
Potassium 6.3 called in from lab, Dr. Karna Christmas notified. Stat repeat potassium ordered. Drawn and sent to lab. Results pending.

## 2020-07-19 NOTE — Progress Notes (Signed)
Repeat potassium 5.9. Dr. Karna Christmas notified.

## 2020-07-19 NOTE — Progress Notes (Signed)
[facility], [place] 07/19/20  Subjective:   LOS: 1 12/24 0701 - 12/25 0700 In: -  Out: 40 [Urine:40] Patient is known to our practice from outpatient follow-up  she was brought into the emergency room via EMS due to altered mental status.  Heart rate was in the 20s when she first arrived.  Blood pressure of 146/121 Initially she received transcutaneous pacing and was started on dopamine drip.  Patient was also noted to be hypothermic with temperature of 85.9. Transvenous pacemaker was placed by cardiologist Nephrology consult has been requested to evaluate for acute kidney injury  Objective:  Vital signs in last 24 hours:  Temp:  [85.9 F (29.9 C)-98.78 F (37.1 C)] 97.34 F (36.3 C) (12/25 0800) Pulse Rate:  [45-63] 49 (12/25 0800) Resp:  [5-27] 18 (12/25 0800) BP: (67-149)/(27-129) 119/52 (12/25 0800) SpO2:  [86 %-100 %] 95 % (12/25 0800) FiO2 (%):  [100 %] 100 % (12/25 0415) Weight:  [72.6 kg] 72.6 kg (12/24 1620)  Weight change:  Filed Weights   08/05/20 1620  Weight: 72.6 kg    Intake/Output:    Intake/Output Summary (Last 24 hours) at 07/19/2020 0918 Last data filed at 07/19/2020 0522 Gross per 24 hour  Intake --  Output 40 ml  Net -40 ml     Physical Exam: General: critically ill appearing  HEENT biPAP mask in place  Pulm/lungs Requiring NIPPV; decreased sounds at bases  CVS/Heart Sinus, regular, in 60's  Abdomen:  soft  Extremities: Trace edema  Neurologic: sedated  Skin: No acute rashes  Access: Rt IJ temp cath   Foley in place    Basic Metabolic Panel:  Recent Labs  Lab 2020-08-05 1600 August 05, 2020 2319 07/19/20 0426  NA 131* 133* 132*  K 5.5* 5.5* 4.4  CL 98 103 102  CO2 20* 20* 19*  GLUCOSE 128* 121* 118*  BUN 56* 55* 57*  CREATININE 3.31* 3.49* 3.74*  CALCIUM 10.4* 9.7 8.9  MG  --   --  1.9  PHOS  --   --  1.8*     CBC: Recent Labs  Lab 05-Aug-2020 1600 07/19/20 0426  WBC 11.4* 7.3  NEUTROABS 8.5*  6.5  HGB 8.6* 8.8*  HCT 25.3* 26.6*  MCV 92.3 93.7  PLT 142* 152     No results found for: HEPBSAG, HEPBSAB, HEPBIGM    Microbiology:  Recent Results (from the past 240 hour(s))  Blood culture (routine single)     Status: None (Preliminary result)   Collection Time: 08-05-2020  3:37 PM   Specimen: BLOOD  Result Value Ref Range Status   Specimen Description BLOOD BLOOD LEFT HAND  Final   Special Requests   Final    BOTTLES DRAWN AEROBIC AND ANAEROBIC Blood Culture results may not be optimal due to an inadequate volume of blood received in culture bottles   Culture   Final    NO GROWTH < 24 HOURS Performed at [facility], [address] [place], [place] [address]    Report Status PENDING  Incomplete  Resp Panel by RT-PCR (Flu A&B, Covid) Nasopharyngeal Swab     Status: None   Collection Time: Aug 05, 2020  4:00 PM   Specimen: Nasopharyngeal Swab; Nasopharyngeal(NP) swabs in vial transport medium  Result Value Ref Range Status   SARS Coronavirus 2 by RT PCR NEGATIVE NEGATIVE Final    Comment: (NOTE) SARS-CoV-2 target nucleic acids are NOT DETECTED.  The SARS-CoV-2 RNA is generally detectable in upper respiratory specimens during  the acute phase of infection. The lowest concentration of SARS-CoV-2 viral copies this assay can detect is 138 copies/mL. A negative result does not preclude SARS-Cov-2 infection and should not be used as the sole basis for treatment or other patient management decisions. A negative result may occur with  improper specimen collection/handling, submission of specimen other than nasopharyngeal swab, presence of viral mutation(s) within the areas targeted by this assay, and inadequate number of viral copies(<138 copies/mL). A negative result must be combined with clinical observations, patient history, and epidemiological information. The expected result is Negative.  Fact Sheet for Patients:  https://www.fda.gov/media/152166/download  Fact  Sheet for Healthcare Providers:  SeriousBroker.ithttps://www.fda.gov/media/152162/download  This test is no t yet approved or cleared by the [place] BloggerCourse.[employer]FDA and  has been authorized for detection and/or diagnosis of SARS-CoV-2 by FDA under an Emergency Use Authorization (EUA). This EUA will remain  in effect (meaning this test can be used) for the duration of the COVID-19 declaration under Section 564(b)(1) of the Act, 21 U.S.C.section 360bbb-3(b)(1), unless the authorization is terminated  or revoked sooner.       Influenza A by PCR NEGATIVE NEGATIVE Final   Influenza B by PCR NEGATIVE NEGATIVE Final    Comment: (NOTE) The Xpert Xpress SARS-CoV-2/FLU/RSV plus assay is intended as an aid in the diagnosis of influenza from Nasopharyngeal swab specimens and should not be used as a sole basis for treatment. Nasal washings and aspirates are unacceptable for Xpert Xpress SARS-CoV-2/FLU/RSV testing.  Fact Sheet for Patients: BloggerCourse.comhttps://www.fda.gov/media/152166/download  Fact Sheet for Healthcare Providers: SeriousBroker.ithttps://www.fda.gov/media/152162/download  This test is not yet approved or cleared by the [place] FDA and has been authorized for detection and/or diagnosis of SARS-CoV-2 by FDA under an Emergency Use Authorization (EUA). This EUA will remain in effect (meaning this test can be used) for the duration of the COVID-19 declaration under Section 564(b)(1) of the Act, 21 U.S.C. section 360bbb-3(b)(1), unless the authorization is terminated or revoked.  Performed at [facility], [address] [place], [place] [address]   MRSA PCR Screening     Status: None   Collection Time: 07/04/2020  5:47 PM   Specimen: Nasopharyngeal  Result Value Ref Range Status   MRSA by PCR NEGATIVE NEGATIVE Final    Comment:        The GeneXpert MRSA Assay (FDA approved for NASAL specimens only), is one component of a comprehensive MRSA colonization surveillance program. It is not intended to  diagnose MRSA infection nor to guide or monitor treatment for MRSA infections. Performed at [facility], [address] [place], [place] [address]     Coagulation Studies: Recent Labs    07/06/2020 1600  LABPROT 12.6  INR 1.0    Urinalysis: Recent Labs    07/19/20 0410  COLORURINE AMBER*  LABSPEC 1.020  PHURINE 5.0  GLUCOSEU NEGATIVE  HGBUR NEGATIVE  BILIRUBINUR NEGATIVE  KETONESUR NEGATIVE  PROTEINUR 100*  NITRITE NEGATIVE  LEUKOCYTESUR NEGATIVE      Imaging: CARDIAC CATHETERIZATION  Result Date: 07/23/2020 Successful transvenous pacemaker placement via the right common femoral vein. Recommendations: During pacemaker placement, the patient was noted to have some intrinsic rhythm with heart rate in the high 40s and low 50s.  Thus, I elected to start the pacemaker rate at 40 bpm to allow intrinsic rhythm.  The patient's hypotension improved with IV fluids and dopamine. Recommend admission to the intensive care unit for further work-up of hypothermia, anemia and acute renal failure. Continue to hold all antihypertensive medications  including metoprolol which is the likely culprit for bradycardia in the setting of acute renal failure with subsequent accumulation. I suspect that the patient's most likely will not require permanent pacemaker.  Once we know that her bradycardia has resolved, the temporary pacemaker can be removed.  DG Chest Port 1 View  Result Date: 07/19/2020 CLINICAL DATA:  Central line placement EXAM: PORTABLE CHEST 1 VIEW COMPARISON:  Earlier today FINDINGS: New dialysis catheter on the right with tip at the upper cavoatrial junction. No pneumothorax. Asymmetric extensive airspace disease on the right more than left. No visible pleural effusion. Cardiomegaly. There is a temporary pacer from below which has a more vertical position today. Remote mid left clavicle fracture. IMPRESSION: 1. New dialysis catheter without complicating feature. 2. Temporary  pacer from below with more vertical positioning of the distal lead, but still likely overlapping the right ventricle. 3. Stable airspace disease. Electronically Signed   By: Marnee Spring M.D.   On: 07/19/2020 05:54   DG Chest Port 1 View  Result Date: 07/19/2020 CLINICAL DATA:  Respiratory failure. EXAM: PORTABLE CHEST 1 VIEW COMPARISON:  07/12/2020 FINDINGS: 0350 hours. The cardio pericardial silhouette is enlarged. Bilateral airspace disease, right greater than left, is similar to prior although progressive in the right base. No pleural effusion. Old left clavicle fracture noted. Telemetry leads overlie the chest. IMPRESSION: Asymmetric right greater than left airspace disease, similar to prior although it is progressive at the right base. Electronically Signed   By: Kennith Center M.D.   On: 07/19/2020 04:03   DG Chest Port 1 View  Result Date: 06/25/2020 CLINICAL DATA:  74 year old female status post pacemaker placement. EXAM: PORTABLE CHEST 1 VIEW COMPARISON:  Chest radiograph dated 07/08/2020. FINDINGS: There is cardiomegaly with vascular congestion and probable mild edema. Patchy area of density in the left mid lung field may represent edema or pneumonia. No pleural effusion pneumothorax. No acute osseous pathology. A pacer wire noted with tip over the spine. IMPRESSION: Mild cardiomegaly and mild vascular congestion. Patchy density in the left mid lung field may represent edema or pneumonia. No pneumothorax. Electronically Signed   By: Elgie Collard M.D.   On: 07/17/2020 18:06   DG Chest Port 1 View  Result Date: 07/13/2020 CLINICAL DATA:  Questionable sepsis. EXAM: PORTABLE CHEST 1 VIEW COMPARISON:  None. FINDINGS: The heart size is enlarged. Evaluation of the left lung field is limited by an overlying defibrillator pad. There appear to be diffuse hazy airspace opacities in the left lung field and to a lesser degree the right. Kerley B lines are noted. There are probable trace bilateral  pleural effusions. There is no pneumothorax. There is an old left clavicle fracture. No definite acute displaced fracture. IMPRESSION: 1. Cardiomegaly with pulmonary edema. 2. Probable trace bilateral pleural effusions. 3. Hazy airspace opacities, left greater than right, may represent an atypical infectious process or developing pulmonary edema. Electronically Signed   By: Katherine Mantle M.D.   On: 07/11/2020 16:14     Medications:   . sodium chloride 50 mL/hr at 07/19/20 0353  . dextrose 5% lactated ringers    . DOPamine 20 mcg/kg/min (07/19/20 0625)  . doxycycline (VIBRAMYCIN) IV 100 mg (07/19/20 0522)  . norepinephrine (LEVOPHED) Adult infusion Stopped (07/19/20 0557)  . sodium phosphate  Dextrose 5% IVPB 20 mmol (07/19/20 0622)  . vasopressin 0.04 Units/min (07/19/20 0531)   . Chlorhexidine Gluconate Cloth  6 each Topical Q0600  . dextrose       heparin sodium (porcine)  Assessment/ Plan:  74 y.o. 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  Acute kidney injury on CKD st 2. Baseline creatinine 1.0/GFR 66 from January 17, 2020 Recent Labs    07/13/2020 1600 06/26/2020 2319 07/19/20 0426  CREATININE 3.31* 3.49* 3.74*  Urinalysis December 25 shows protein 100 mg/dL, 81-27 RBCs, 51-70 WBCs We will obtain a urine protein to creatinine ratio We will obtain renal ultrasound Covid panel is negative AKI likely secondary to ATN due to hemodynamic instability from bradycardia Patient has some urine output although serum creatinine trends are worsening -Discontinue maintenance IV fluids -Electrolytes and volume status are acceptable.  No acute indication for dialysis.  Temporary dialysis catheter is available.  We will continue to monitor on a daily basis  #. Anemia   Lab Results  Component Value Date   HGB 8.8  (L) 07/19/2020   Monitor closely   #.  Bradycardia and hypotension Temporary pacemaker has been placed Antihypertensives are on hold Currently getting dopamine and vasopressin infusion Check cortisol  # Acute resp failure Requiring biPAP Cause: volume overload vs pneumonia    LOS: 1 Ival Basquez 12/25/20219:18 AM  [facility] [place], [place] [phone]

## 2020-07-19 NOTE — Progress Notes (Signed)
CRITICAL CARE PROGRESS NOTE    Name: Julie Roberts MRN: [id number] DOB: 12/11/1945     LOS: 1   SUBJECTIVE FINDINGS & SIGNIFICANT EVENTS    Patient description:   74 yo F PMH as below, daughter gives history as patient is with encephalopathy.  Daughter states despite significant comorbid history she was able to drive 1 month ago and does all ADLs on her own.  She has been unsteady on feet with disequilibrium over past 2 months and she has had MRI brian for possible CVA on outpatient.  She was brought to ED due to worsening dizziness and altered mentation and found to have profound bradycardia in the teens.She had transcutaneous pacing placed. She was emergently seen by cardiology who was able to place transvenous TPM.  She was also noted to have A/CKD and hypothermia with admission to MICU for additional evaluation and management.    07/19/20- patient is improved.  I spoke with Dr Elease HashimotoNahser regarding cardiology plan-[facility]. Daughter at bedside reviewed care plan. Neurology consult for confusion.   Lines/tubes :   Microbiology/Sepsis markers: Results for orders placed or performed during the hospital encounter of 07/11/2020  Blood culture (routine single)     Status: None (Preliminary result)   Collection Time: 06/30/2020  3:37 PM   Specimen: BLOOD  Result Value Ref Range Status   Specimen Description BLOOD BLOOD LEFT HAND  Final   Special Requests   Final    BOTTLES DRAWN AEROBIC AND ANAEROBIC Blood Culture results may not be optimal due to an inadequate volume of blood received in culture bottles   Culture   Final    NO GROWTH < 24 HOURS Performed at [facility], [address] [place], [place] [address]    Report Status PENDING  Incomplete  Resp Panel by RT-PCR (Flu A&B, Covid)  Nasopharyngeal Swab     Status: None   Collection Time: 07/22/2020  4:00 PM   Specimen: Nasopharyngeal Swab; Nasopharyngeal(NP) swabs in vial transport medium  Result Value Ref Range Status   SARS Coronavirus 2 by RT PCR NEGATIVE NEGATIVE Final    Comment: (NOTE) SARS-CoV-2 target nucleic acids are NOT DETECTED.  The SARS-CoV-2 RNA is generally detectable in upper respiratory specimens during the acute phase of infection. The lowest concentration of SARS-CoV-2 viral copies this assay can detect is 138 copies/mL. A negative result does not preclude SARS-Cov-2 infection and should not be used as the sole basis for treatment or other patient management decisions. A negative result may occur with  improper specimen collection/handling, submission of specimen other than nasopharyngeal swab, presence of viral mutation(s) within the areas targeted by this assay, and inadequate number of viral copies(<138 copies/mL). A negative result must be combined with clinical observations, patient history, and epidemiological information. The expected result is Negative.  Fact Sheet for Patients:  BloggerCourse.comhttps://www.fda.gov/media/152166/download  Fact Sheet for Healthcare Providers:  SeriousBroker.ithttps://www.fda.gov/media/152162/download  This test is no t yet approved or cleared by the [place] FDA and  has been authorized for detection and/or diagnosis of SARS-CoV-2 by FDA under an Emergency Use Authorization (EUA). This EUA will remain  in effect (meaning this test can be used) for the duration of the COVID-19 declaration under Section 564(b)(1) of the Act, 21 U.S.C.section 360bbb-3(b)(1), unless the authorization is terminated  or revoked sooner.       Influenza A by PCR NEGATIVE NEGATIVE Final   Influenza B by PCR NEGATIVE NEGATIVE Final    Comment: (NOTE) The Xpert  Xpress SARS-CoV-2/FLU/RSV plus assay is intended as an aid in the diagnosis of influenza from Nasopharyngeal swab specimens and should not be  used as a sole basis for treatment. Nasal washings and aspirates are unacceptable for Xpert Xpress SARS-CoV-2/FLU/RSV testing.  Fact Sheet for Patients: BloggerCourse.com  Fact Sheet for Healthcare Providers: SeriousBroker.it  This test is not yet approved or cleared by the [place] FDA and has been authorized for detection and/or diagnosis of SARS-CoV-2 by FDA under an Emergency Use Authorization (EUA). This EUA will remain in effect (meaning this test can be used) for the duration of the COVID-19 declaration under Section 564(b)(1) of the Act, 21 U.S.C. section 360bbb-3(b)(1), unless the authorization is terminated or revoked.  Performed at [facility], [address] [place], [place] [address]   MRSA PCR Screening     Status: None   Collection Time: Aug 14, 2020  5:47 PM   Specimen: Nasopharyngeal  Result Value Ref Range Status   MRSA by PCR NEGATIVE NEGATIVE Final    Comment:        The GeneXpert MRSA Assay (FDA approved for NASAL specimens only), is one component of a comprehensive MRSA colonization surveillance program. It is not intended to diagnose MRSA infection nor to guide or monitor treatment for MRSA infections. Performed at [facility], [address], [place], [place] [address]     Anti-infectives:  Anti-infectives (From admission, onward)   Start     Dose/Rate Route Frequency Ordered Stop   07/19/20 0500  doxycycline (VIBRAMYCIN) 100 mg in sodium chloride 0.9 % 250 mL IVPB        100 mg 125 mL/hr over 120 Minutes Intravenous Every 12 hours 07/19/20 0357         Consults: Treatment Team:  Iran Ouch, MD Mosetta Pigeon, MD      PAST MEDICAL HISTORY   Past Medical History:  Diagnosis Date  . Asthma   . Hypertension      SURGICAL HISTORY   Past Surgical History:  Procedure Laterality Date  . BREAST BIOPSY Left 2013   neg     FAMILY HISTORY   Family  History  Problem Relation Age of Onset  . Breast cancer Neg Hx      SOCIAL HISTORY       MEDICATIONS   Current Medication:  Current Facility-Administered Medications:  .  Chlorhexidine Gluconate Cloth 2 % PADS 6 each, 6 each, Topical, Q0600, Vida Rigger, MD, 6 each at 07/19/20 0300 .  dextrose 10 % infusion, , Intravenous, Continuous, Mindie Rawdon, MD, Last Rate: 30 mL/hr at 07/19/20 0943, New Bag at 07/19/20 0943 .  dextrose 50 % solution, , , ,  .  DOPamine (INTROPIN) 800 mg in dextrose 5 % 250 mL (3.2 mg/mL) infusion, 0-20 mcg/kg/min, Intravenous, Continuous, Blakeney, Neldon Newport, NP, Last Rate: 27.2 mL/hr at 07/19/20 0625, 20 mcg/kg/min at 07/19/20 0625 .  doxycycline (VIBRAMYCIN) 100 mg in sodium chloride 0.9 % 250 mL IVPB, 100 mg, Intravenous, Q12H, Blakeney, Neldon Newport, NP, Last Rate: 125 mL/hr at 07/19/20 0522, 100 mg at 07/19/20 0522 .  heparin sodium (porcine) injection 1,000-6,000 Units, 1,000-6,000 Units, Intravenous, PRN, Eugenie Norrie, NP .  norepinephrine (LEVOPHED) 16 mg in premix infusion, 0-40 mcg/min, Intravenous, Titrated, Eugenie Norrie, NP, Held at 07/19/20 [phone] .  vasopressin (PITRESSIN) 20 Units in sodium chloride 0.9 % 100 mL infusion-*FOR SHOCK*, 0-0.04 Units/min, Intravenous, Continuous, Blakeney, Dana G, NP, Last Rate: 12 mL/hr at 07/19/20 0531, 0.04 Units/min at  07/19/20 0531    ALLERGIES   Penicillins and Ace inhibitors    REVIEW OF SYSTEMS    Unable to obtain due to encephalopathy  PHYSICAL EXAMINATION   Vital Signs: Temp:  [85.9 F (29.9 C)-98.78 F (37.1 C)] 97.34 F (36.3 C) (12/25 0800) Pulse Rate:  [45-63] 49 (12/25 0800) Resp:  [5-27] 18 (12/25 0800) BP: (67-149)/(27-129) 119/52 (12/25 0800) SpO2:  [86 %-100 %] 95 % (12/25 0800) FiO2 (%):  [100 %] 100 % (12/25 0415) Weight:  [72.6 kg] 72.6 kg (12/24 1620)  GENERAL:age appropriate HEAD: Normocephalic, atraumatic.  EYES: Pupils equal, round, reactive to light.  No  scleral icterus.  MOUTH: Moist mucosal membrane. NECK: Supple. No thyromegaly. No nodules. No JVD.  PULMONARY: decreased air entry bilaterally  CARDIOVASCULAR: S1 and S2. Regular rate and rhythm. No murmurs, rubs, or gallops.  GASTROINTESTINAL: Soft, nontender, non-distended. No masses. Positive bowel sounds. No hepatosplenomegaly.  MUSCULOSKELETAL: No swelling, clubbing, or edema.  NEUROLOGIC: GCS10 SKIN:intact,warm,dry   PERTINENT DATA     Infusions: . dextrose 30 mL/hr at 07/19/20 0943  . DOPamine 20 mcg/kg/min (07/19/20 0625)  . doxycycline (VIBRAMYCIN) IV 100 mg (07/19/20 0522)  . norepinephrine (LEVOPHED) Adult infusion Stopped (07/19/20 0557)  . vasopressin 0.04 Units/min (07/19/20 0531)   Scheduled Medications: . Chlorhexidine Gluconate Cloth  6 each Topical Q0600  . dextrose       PRN Medications:  Hemodynamic parameters:   Intake/Output: 12/24 0701 - 12/25 0700 In: -  Out: 40 [Urine:40]  Ventilator  Settings: FiO2 (%):  [100 %] 100 %   LAB RESULTS:  Basic Metabolic Panel: Recent Labs  Lab 07/08/2020 1600 06/27/2020 2319 07/19/20 0426  NA 131* 133* 132*  K 5.5* 5.5* 4.4  CL 98 103 102  CO2 20* 20* 19*  GLUCOSE 128* 121* 118*  BUN 56* 55* 57*  CREATININE 3.31* 3.49* 3.74*  CALCIUM 10.4* 9.7 8.9  MG  --   --  1.9  PHOS  --   --  1.8*   Liver Function Tests: Recent Labs  Lab 06/28/2020 1600 07/19/20 0426  AST 61*  --   ALT 56*  --   ALKPHOS 152*  --   BILITOT 0.5  --   PROT 6.2*  --   ALBUMIN 3.1* 2.4*   No results for input(s): LIPASE, AMYLASE in the last 168 hours. No results for input(s): AMMONIA in the last 168 hours. CBC: Recent Labs  Lab 07/19/2020 1600 07/19/20 0426  WBC 11.4* 7.3  NEUTROABS 8.5* 6.5  HGB 8.6* 8.8*  HCT 25.3* 26.6*  MCV 92.3 93.7  PLT 142* 152   Cardiac Enzymes: No results for input(s): CKTOTAL, CKMB, CKMBINDEX, TROPONINI in the last 168 hours. BNP: Invalid input(s): POCBNP CBG: Recent Labs  Lab  07/19/20 0346 07/19/20 0416 07/19/20 0744 07/19/20 1047 07/19/20 1223  GLUCAP <10* 136* 60* 112* 114*       IMAGING RESULTS:  Imaging: CARDIAC CATHETERIZATION  Result Date: 07/08/2020 Successful transvenous pacemaker placement via the right common femoral vein. Recommendations: During pacemaker placement, the patient was noted to have some intrinsic rhythm with heart rate in the high 40s and low 50s.  Thus, I elected to start the pacemaker rate at 40 bpm to allow intrinsic rhythm.  The patient's hypotension improved with IV fluids and dopamine. Recommend admission to the intensive care unit for further work-up of hypothermia, anemia and acute renal failure. Continue to hold all antihypertensive medications including metoprolol which is the likely culprit for bradycardia in the  setting of acute renal failure with subsequent accumulation. I suspect that the patient's most likely will not require permanent pacemaker.  Once we know that her bradycardia has resolved, the temporary pacemaker can be removed.  DG Chest Port 1 View  Result Date: 07/19/2020 CLINICAL DATA:  Central line placement EXAM: PORTABLE CHEST 1 VIEW COMPARISON:  Earlier today FINDINGS: New dialysis catheter on the right with tip at the upper cavoatrial junction. No pneumothorax. Asymmetric extensive airspace disease on the right more than left. No visible pleural effusion. Cardiomegaly. There is a temporary pacer from below which has a more vertical position today. Remote mid left clavicle fracture. IMPRESSION: 1. New dialysis catheter without complicating feature. 2. Temporary pacer from below with more vertical positioning of the distal lead, but still likely overlapping the right ventricle. 3. Stable airspace disease. Electronically Signed   By: Marnee Spring M.D.   On: 07/19/2020 05:54   DG Chest Port 1 View  Result Date: 07/19/2020 CLINICAL DATA:  Respiratory failure. EXAM: PORTABLE CHEST 1 VIEW COMPARISON:   08/04/20 FINDINGS: 0350 hours. The cardio pericardial silhouette is enlarged. Bilateral airspace disease, right greater than left, is similar to prior although progressive in the right base. No pleural effusion. Old left clavicle fracture noted. Telemetry leads overlie the chest. IMPRESSION: Asymmetric right greater than left airspace disease, similar to prior although it is progressive at the right base. Electronically Signed   By: Kennith Center M.D.   On: 07/19/2020 04:03   DG Chest Port 1 View  Result Date: 08/04/20 CLINICAL DATA:  74 year old female status post pacemaker placement. EXAM: PORTABLE CHEST 1 VIEW COMPARISON:  Chest radiograph dated 2020/08/04. FINDINGS: There is cardiomegaly with vascular congestion and probable mild edema. Patchy area of density in the left mid lung field may represent edema or pneumonia. No pleural effusion pneumothorax. No acute osseous pathology. A pacer wire noted with tip over the spine. IMPRESSION: Mild cardiomegaly and mild vascular congestion. Patchy density in the left mid lung field may represent edema or pneumonia. No pneumothorax. Electronically Signed   By: Elgie Collard M.D.   On: August 04, 2020 18:06   DG Chest Port 1 View  Result Date: 04-Aug-2020 CLINICAL DATA:  Questionable sepsis. EXAM: PORTABLE CHEST 1 VIEW COMPARISON:  None. FINDINGS: The heart size is enlarged. Evaluation of the left lung field is limited by an overlying defibrillator pad. There appear to be diffuse hazy airspace opacities in the left lung field and to a lesser degree the right. Kerley B lines are noted. There are probable trace bilateral pleural effusions. There is no pneumothorax. There is an old left clavicle fracture. No definite acute displaced fracture. IMPRESSION: 1. Cardiomegaly with pulmonary edema. 2. Probable trace bilateral pleural effusions. 3. Hazy airspace opacities, left greater than right, may represent an atypical infectious process or developing pulmonary edema.  Electronically Signed   By: Katherine Mantle M.D.   On: 08-04-2020 16:14   @PROBHOSP @ CARDIAC CATHETERIZATION  Result Date: Aug 04, 2020 Successful transvenous pacemaker placement via the right common femoral vein. Recommendations: During pacemaker placement, the patient was noted to have some intrinsic rhythm with heart rate in the high 40s and low 50s.  Thus, I elected to start the pacemaker rate at 40 bpm to allow intrinsic rhythm.  The patient's hypotension improved with IV fluids and dopamine. Recommend admission to the intensive care unit for further work-up of hypothermia, anemia and acute renal failure. Continue to hold all antihypertensive medications including metoprolol which is the likely culprit for bradycardia in  the setting of acute renal failure with subsequent accumulation. I suspect that the patient's most likely will not require permanent pacemaker.  Once we know that her bradycardia has resolved, the temporary pacemaker can be removed.  DG Chest Port 1 View  Result Date: 07/19/2020 CLINICAL DATA:  Central line placement EXAM: PORTABLE CHEST 1 VIEW COMPARISON:  Earlier today FINDINGS: New dialysis catheter on the right with tip at the upper cavoatrial junction. No pneumothorax. Asymmetric extensive airspace disease on the right more than left. No visible pleural effusion. Cardiomegaly. There is a temporary pacer from below which has a more vertical position today. Remote mid left clavicle fracture. IMPRESSION: 1. New dialysis catheter without complicating feature. 2. Temporary pacer from below with more vertical positioning of the distal lead, but still likely overlapping the right ventricle. 3. Stable airspace disease. Electronically Signed   By: Marnee Spring M.D.   On: 07/19/2020 05:54   DG Chest Port 1 View  Result Date: 07/19/2020 CLINICAL DATA:  Respiratory failure. EXAM: PORTABLE CHEST 1 VIEW COMPARISON:  06/26/2020 FINDINGS: 0350 hours. The cardio pericardial silhouette  is enlarged. Bilateral airspace disease, right greater than left, is similar to prior although progressive in the right base. No pleural effusion. Old left clavicle fracture noted. Telemetry leads overlie the chest. IMPRESSION: Asymmetric right greater than left airspace disease, similar to prior although it is progressive at the right base. Electronically Signed   By: Kennith Center M.D.   On: 07/19/2020 04:03   DG Chest Port 1 View  Result Date: 07/05/2020 CLINICAL DATA:  74 year old female status post pacemaker placement. EXAM: PORTABLE CHEST 1 VIEW COMPARISON:  Chest radiograph dated 06/29/2020. FINDINGS: There is cardiomegaly with vascular congestion and probable mild edema. Patchy area of density in the left mid lung field may represent edema or pneumonia. No pleural effusion pneumothorax. No acute osseous pathology. A pacer wire noted with tip over the spine. IMPRESSION: Mild cardiomegaly and mild vascular congestion. Patchy density in the left mid lung field may represent edema or pneumonia. No pneumothorax. Electronically Signed   By: Elgie Collard M.D.   On: 06/25/2020 18:06   DG Chest Port 1 View  Result Date: 07/07/2020 CLINICAL DATA:  Questionable sepsis. EXAM: PORTABLE CHEST 1 VIEW COMPARISON:  None. FINDINGS: The heart size is enlarged. Evaluation of the left lung field is limited by an overlying defibrillator pad. There appear to be diffuse hazy airspace opacities in the left lung field and to a lesser degree the right. Kerley B lines are noted. There are probable trace bilateral pleural effusions. There is no pneumothorax. There is an old left clavicle fracture. No definite acute displaced fracture. IMPRESSION: 1. Cardiomegaly with pulmonary edema. 2. Probable trace bilateral pleural effusions. 3. Hazy airspace opacities, left greater than right, may represent an atypical infectious process or developing pulmonary edema. Electronically Signed   By: Katherine Mantle M.D.   On:  07/10/2020 16:14     ASSESSMENT AND PLAN    -Multidisciplinary rounds held today  Symptomatic bradyarrythmia   Cardiology on case - Dr Kirke Corin - appreciate input - likely due to CKD with reaccumulation of BB -please follow cardiology recommendations   Acute on chronic renal failure  - d/c nonessential nephrotoxins ICU monitoring -follow chem 7 -follow UO -continue Foley Catheter-assess need daily   Altered mental status with encephalopathy    -patient is at risk of inability to protect airway    - likely due to toxic metabolic encephalopathy Wake up assessment pending    -  neurology consultation - Dr Herma Carson - appreciate input  ID -continue IV abx as prescibed -follow up cultures  GI/Nutrition GI PROPHYLAXIS as indicated DIET-->TF's as tolerated Constipation protocol as indicated  ENDO - ICU hypoglycemic\Hyperglycemia protocol -check FSBS per protocol   ELECTROLYTES -follow labs as needed -replace as needed -pharmacy consultation   DVT/GI PRX ordered -SCDs  TRANSFUSIONS AS NEEDED MONITOR FSBS ASSESS the need for LABS as needed   Critical care provider statement:    Critical care time (minutes):  33   Critical care time was exclusive of:  Separately billable procedures and treating other patients   Critical care was necessary to treat or prevent imminent or life-threatening deterioration of the following conditions:  altered mental status, encephalopathy, symptomatic bradyarrythmia, Acute on chronic renal failure, multiple comorbid conditions.   Critical care was time spent personally by me on the following activities:  Development of treatment plan with patient or surrogate, discussions with consultants, evaluation of patient's response to treatment, examination of patient, obtaining history from patient or surrogate, ordering and performing treatments and interventions, ordering and review of laboratory studies and re-evaluation of patient's condition.  I assumed  direction of critical care for this patient from another provider in my specialty: no    This document was prepared using Dragon voice recognition software and may include unintentional dictation errors.    Vida Rigger, M.D.  Division of Pulmonary & Critical Care Medicine  [facility]

## 2020-07-20 ENCOUNTER — Inpatient Hospital Stay: Payer: Medicare Other

## 2020-07-20 DIAGNOSIS — R001 Bradycardia, unspecified: Secondary | ICD-10-CM | POA: Diagnosis not present

## 2020-07-20 LAB — PROTEIN / CREATININE RATIO, URINE
Creatinine, Urine: 44 mg/dL
Protein Creatinine Ratio: 0.7 mg/mg{Cre} — ABNORMAL HIGH (ref 0.00–0.15)
Total Protein, Urine: 31 mg/dL

## 2020-07-20 LAB — CBC
HCT: 24.2 % — ABNORMAL LOW (ref 36.0–46.0)
Hemoglobin: 8.7 g/dL — ABNORMAL LOW (ref 12.0–15.0)
MCH: 31.6 pg (ref 26.0–34.0)
MCHC: 36 g/dL (ref 30.0–36.0)
MCV: 88 fL (ref 80.0–100.0)
Platelets: 101 10*3/uL — ABNORMAL LOW (ref 150–400)
RBC: 2.75 MIL/uL — ABNORMAL LOW (ref 3.87–5.11)
RDW: 15.8 % — ABNORMAL HIGH (ref 11.5–15.5)
WBC: 8.7 10*3/uL (ref 4.0–10.5)
nRBC: 3.9 % — ABNORMAL HIGH (ref 0.0–0.2)

## 2020-07-20 LAB — RENAL FUNCTION PANEL
Albumin: 2.4 g/dL — ABNORMAL LOW (ref 3.5–5.0)
Anion gap: 9 (ref 5–15)
BUN: 54 mg/dL — ABNORMAL HIGH (ref 8–23)
CO2: 26 mmol/L (ref 22–32)
Calcium: 8.8 mg/dL — ABNORMAL LOW (ref 8.9–10.3)
Chloride: 106 mmol/L (ref 98–111)
Creatinine, Ser: 3.17 mg/dL — ABNORMAL HIGH (ref 0.44–1.00)
GFR, Estimated: 15 mL/min — ABNORMAL LOW (ref >60)
Glucose, Bld: 98 mg/dL (ref 70–99)
Phosphorus: 4.3 mg/dL (ref 2.5–4.6)
Potassium: 5 mmol/L (ref 3.5–5.1)
Sodium: 141 mmol/L (ref 135–145)

## 2020-07-20 LAB — MAGNESIUM: Magnesium: 1.8 mg/dL (ref 1.7–2.4)

## 2020-07-20 LAB — BLOOD GAS, VENOUS
Acid-base deficit: 0.3 mmol/L (ref 0.0–2.0)
Bicarbonate: 24 mmol/L (ref 20.0–28.0)
FIO2: 1
Mechanical Rate: 10
O2 Saturation: 87.1 %
Patient temperature: 37
pCO2, Ven: 37 mmHg — ABNORMAL LOW (ref 44.0–60.0)
pH, Ven: 7.42 (ref 7.250–7.430)
pO2, Ven: 52 mmHg — ABNORMAL HIGH (ref 32.0–45.0)

## 2020-07-20 LAB — GLUCOSE, CAPILLARY
Glucose-Capillary: 87 mg/dL (ref 70–99)
Glucose-Capillary: 94 mg/dL (ref 70–99)
Glucose-Capillary: 80 mg/dL (ref 70–99)
Glucose-Capillary: 93 mg/dL (ref 70–99)
Glucose-Capillary: 79 mg/dL (ref 70–99)
Glucose-Capillary: 105 mg/dL — ABNORMAL HIGH (ref 70–99)

## 2020-07-20 LAB — CORTISOL: Cortisol, Plasma: 57 ug/dL

## 2020-07-20 LAB — PROCALCITONIN: Procalcitonin: 4.98 ng/mL

## 2020-07-20 MED ORDER — DEXMEDETOMIDINE HCL IN NACL 400 MCG/100ML IV SOLN
0.4000 ug/kg/h | INTRAVENOUS | Status: DC
  Administered 2020-07-20: 22:00:00 0.4 ug/kg/h via INTRAVENOUS
  Administered 2020-07-21: 0.8 ug/kg/h via INTRAVENOUS
  Administered 2020-07-21: 14:00:00 0.4 ug/kg/h via INTRAVENOUS
  Administered 2020-07-22: 10:00:00 0.7 ug/kg/h via INTRAVENOUS
  Administered 2020-07-22: 04:00:00 1.2 ug/kg/h via INTRAVENOUS
  Filled 2020-07-20 (×5): qty 100

## 2020-07-20 MED ORDER — SODIUM CHLORIDE 0.9 % IV SOLN
500.0000 mg | Freq: Two times a day (BID) | INTRAVENOUS | Status: DC
  Administered 2020-07-20 – 2020-07-21 (×2): 500 mg via INTRAVENOUS
  Filled 2020-07-20: qty 500
  Filled 2020-07-20: qty 0.5
  Filled 2020-07-20: qty 500

## 2020-07-20 MED ORDER — SODIUM CHLORIDE 0.9 % IV SOLN
INTRAVENOUS | Status: DC | PRN
  Administered 2020-07-20: 250 mL via INTRAVENOUS

## 2020-07-20 MED ORDER — FUROSEMIDE 10 MG/ML IJ SOLN
20.0000 mg | Freq: Once | INTRAMUSCULAR | Status: AC
  Administered 2020-07-20: 14:00:00 20 mg via INTRAVENOUS
  Filled 2020-07-20: qty 2

## 2020-07-20 MED ORDER — SODIUM CHLORIDE 0.9 % IV SOLN
2.0000 g | INTRAVENOUS | Status: DC
  Filled 2020-07-20: qty 2

## 2020-07-20 MED ORDER — DEXMEDETOMIDINE HCL IN NACL 400 MCG/100ML IV SOLN
0.1000 ug/kg/h | INTRAVENOUS | Status: DC
  Administered 2020-07-20: 01:00:00 0.4 ug/kg/h via INTRAVENOUS
  Administered 2020-07-20: 08:00:00 0.9 ug/kg/h via INTRAVENOUS
  Filled 2020-07-20 (×3): qty 100

## 2020-07-20 MED ORDER — MAGNESIUM SULFATE 2 GM/50ML IV SOLN
2.0000 g | Freq: Once | INTRAVENOUS | Status: AC
  Administered 2020-07-20: 18:00:00 2 g via INTRAVENOUS
  Filled 2020-07-20: qty 50

## 2020-07-20 NOTE — Progress Notes (Signed)
R groin sheath removed, pt tolerated well. VSS. Dopamine weaned off. Precedex discontinued. Bi-pap removed and nasal cannula applied at 5-6 L, Pt began to de-sat as low as 79% and Bi-pap re-applied. Pt is more awake and crying out, but unable to follow commands or make comprehensible speech. Will continue to monitor.

## 2020-07-20 NOTE — Progress Notes (Signed)
Progress Note  Patient Name: Julie Roberts Date of Encounter: 07/20/2020  [facility] Cardiologist: Kirke Corin    Subjective   74 year old female who was admitted to the hospital several days ago  with altered mental status.  She was found to have a heart rate of 15-20.  She was hypothermic.  Transvenous pacemaker was placed by Dr. Kirke Corin.  She has acute kidney injury.   Her HR is currently 80s.   Sinus rhythm Pacing wire was removed yesterday  Still unresponsive    Inpatient Medications    Scheduled Meds: . Chlorhexidine Gluconate Cloth  6 each Topical Q0600   Continuous Infusions: . dexmedetomidine (PRECEDEX) IV infusion 0.9 mcg/kg/hr (07/20/20 0809)  . dextrose 30 mL/hr at 07/20/20 0809  . DOPamine 5 mcg/kg/min (07/20/20 0809)  . doxycycline (VIBRAMYCIN) IV Stopped (07/20/20 1610)  . norepinephrine (LEVOPHED) Adult infusion Stopped (07/19/20 0557)  . vasopressin Stopped (07/19/20 1944)   PRN Meds: heparin sodium (porcine)   Vital Signs    Vitals:   07/20/20 0400 07/20/20 0500 07/20/20 0734 07/20/20 0800  BP: (!) 117/53   (!) 118/55  Pulse: 90  91 88  Resp: 15   20  Temp: (!) 97 F (36.1 C)   (!) 96.62 F (35.9 C)  TempSrc: Rectal   Rectal  SpO2: 100%  100% 100%  Weight:  76.3 kg    Height:        Intake/Output Summary (Last 24 hours) at 07/20/2020 0903 Last data filed at 07/20/2020 0809 Gross per 24 hour  Intake 2325 ml  Output 3450 ml  Net -1125 ml   Last 3 Weights 07/20/2020 07/02/2020  Weight (lbs) 168 lb 3.4 oz 160 lb  Weight (kg) 76.3 kg 72.576 kg      Telemetry    Sinus rhythm in 80s   ECG     - Personally Reviewed  Physical Exam  Physical Exam: Blood pressure (!) 118/55, pulse 88, temperature (!) 96.62 F (35.9 C), temperature source Rectal, resp. rate 20, height  (1.6 m), weight 76.3 kg, SpO2 100 %.  GEN:   Elderly female,  Sedated,  Unresponsive, on bipap  HEENT: Normal NECK: No JVD; No carotid bruits LYMPHATICS: No  lymphadenopathy CARDIAC: RRR   RESPIRATORY:   On bipap  ABDOMEN: Soft, non-tender, non-distended MUSCULOSKELETAL:  No edema; No deformity  SKIN: Warm and dry NEUROLOGIC:  Unresponsive    Labs    High Sensitivity Troponin:  No results for input(s): TROPONINIHS in the last 720 hours.    Chemistry Recent Labs  Lab 07/09/2020 1600 07/05/2020 2319 07/19/20 0426 07/19/20 1405 07/19/20 1527 07/19/20 2021 07/20/20 0416  NA 131*   < > 132* 135  --   --  141  K 5.5*   < > 4.4 6.3* 5.9* 5.7* 5.0  CL 98   < > 102 103  --   --  106  CO2 20*   < > 19* 19*  --   --  26  GLUCOSE 128*   < > 118* 119*  --   --  98  BUN 56*   < > 57* 58*  --   --  54*  CREATININE 3.31*   < > 3.74* 3.75*  --   --  3.17*  CALCIUM 10.4*   < > 8.9 9.2  --   --  8.8*  PROT 6.2*  --   --   --   --   --   --   ALBUMIN 3.1*  --  2.4*  --   --   --  2.4*  AST 61*  --   --   --   --   --   --   ALT 56*  --   --   --   --   --   --   ALKPHOS 152*  --   --   --   --   --   --   BILITOT 0.5  --   --   --   --   --   --   GFRNONAA 14*   < > 12* 12*  --   --  15*  ANIONGAP 13   < > 11 13  --   --  9   < > = values in this interval not displayed.     Hematology Recent Labs  Lab 07/12/2020 1600 07/19/20 0426 07/20/20 0416  WBC 11.4* 7.3 8.7  RBC 2.74* 2.84* 2.75*  HGB 8.6* 8.8* 8.7*  HCT 25.3* 26.6* 24.2*  MCV 92.3 93.7 88.0  MCH 31.4 31.0 31.6  MCHC 34.0 33.1 36.0  RDW 16.3* 16.1* 15.8*  PLT 142* 152 101*    BNPNo results for input(s): BNP, PROBNP in the last 168 hours.   DDimer No results for input(s): DDIMER in the last 168 hours.   Radiology    CARDIAC CATHETERIZATION  Result Date: 07/13/2020 Successful transvenous pacemaker placement via the right common femoral vein. Recommendations: During pacemaker placement, the patient was noted to have some intrinsic rhythm with heart rate in the high 40s and low 50s.  Thus, I elected to start the pacemaker rate at 40 bpm to allow intrinsic rhythm.  The patient's  hypotension improved with IV fluids and dopamine. Recommend admission to the intensive care unit for further work-up of hypothermia, anemia and acute renal failure. Continue to hold all antihypertensive medications including metoprolol which is the likely culprit for bradycardia in the setting of acute renal failure with subsequent accumulation. I suspect that the patient's most likely will not require permanent pacemaker.  Once we know that her bradycardia has resolved, the temporary pacemaker can be removed.  DG Chest Port 1 View  Result Date: 07/19/2020 CLINICAL DATA:  74 year old female with shortness of breath EXAM: PORTABLE CHEST 1 VIEW COMPARISON:  Chest radiograph dated 07/19/2020. FINDINGS: Interval removal of the pacer wire. Right IJ dialysis catheter with tip at the cavoatrial junction. Bilateral pulmonary opacities, right greater left, improved since the prior radiograph. No pleural effusion or pneumothorax. Stable cardiac silhouette. Atherosclerotic calcification of the aorta. No acute osseous pathology. IMPRESSION: Slight interval improvement of bilateral airspace opacities. Electronically Signed   By: Elgie Collard M.D.   On: 07/19/2020 20:25   DG Chest Port 1 View  Result Date: 07/19/2020 CLINICAL DATA:  Central line placement EXAM: PORTABLE CHEST 1 VIEW COMPARISON:  Earlier today FINDINGS: New dialysis catheter on the right with tip at the upper cavoatrial junction. No pneumothorax. Asymmetric extensive airspace disease on the right more than left. No visible pleural effusion. Cardiomegaly. There is a temporary pacer from below which has a more vertical position today. Remote mid left clavicle fracture. IMPRESSION: 1. New dialysis catheter without complicating feature. 2. Temporary pacer from below with more vertical positioning of the distal lead, but still likely overlapping the right ventricle. 3. Stable airspace disease. Electronically Signed   By: Marnee Spring M.D.   On:  07/19/2020 05:54   DG Chest Port 1 View  Result Date: 07/19/2020 CLINICAL DATA:  Respiratory failure. EXAM: PORTABLE CHEST 1 VIEW COMPARISON:  07/23/2020 FINDINGS: 0350 hours. The cardio pericardial silhouette is enlarged. Bilateral airspace disease, right greater than left, is similar to prior although progressive in the right base. No pleural effusion. Old left clavicle fracture noted. Telemetry leads overlie the chest. IMPRESSION: Asymmetric right greater than left airspace disease, similar to prior although it is progressive at the right base. Electronically Signed   By: Kennith Center M.D.   On: 07/19/2020 04:03   DG Chest Port 1 View  Result Date: 06/25/2020 CLINICAL DATA:  74 year old female status post pacemaker placement. EXAM: PORTABLE CHEST 1 VIEW COMPARISON:  Chest radiograph dated 07/11/2020. FINDINGS: There is cardiomegaly with vascular congestion and probable mild edema. Patchy area of density in the left mid lung field may represent edema or pneumonia. No pleural effusion pneumothorax. No acute osseous pathology. A pacer wire noted with tip over the spine. IMPRESSION: Mild cardiomegaly and mild vascular congestion. Patchy density in the left mid lung field may represent edema or pneumonia. No pneumothorax. Electronically Signed   By: Elgie Collard M.D.   On: 07/03/2020 18:06   DG Chest Port 1 View  Result Date: 06/30/2020 CLINICAL DATA:  Questionable sepsis. EXAM: PORTABLE CHEST 1 VIEW COMPARISON:  None. FINDINGS: The heart size is enlarged. Evaluation of the left lung field is limited by an overlying defibrillator pad. There appear to be diffuse hazy airspace opacities in the left lung field and to a lesser degree the right. Kerley B lines are noted. There are probable trace bilateral pleural effusions. There is no pneumothorax. There is an old left clavicle fracture. No definite acute displaced fracture. IMPRESSION: 1. Cardiomegaly with pulmonary edema. 2. Probable trace bilateral  pleural effusions. 3. Hazy airspace opacities, left greater than right, may represent an atypical infectious process or developing pulmonary edema. Electronically Signed   By: Katherine Mantle M.D.   On: 07/12/2020 16:14   ECHOCARDIOGRAM COMPLETE  Result Date: 07/19/2020    ECHOCARDIOGRAM REPORT   Patient Name:   DOMINICA KENT Date of Exam: 07/19/2020 Medical Rec #:  [id number]       Height:       63.0 in Accession #:    [id number]      Weight:       160.0 lb Date of Birth:  [birth year]        BSA:          1.759 m Patient Age:    74 years        BP:           104/42 mmHg Patient Gender: F               HR:           49 bpm. Exam Location:  [facility] Procedure: 2D Echo Indications:     Acute Respiratory Distress  History:         Patient has no prior history of Echocardiogram examinations.                  Stroke; Risk Factors:Hypertension.  Sonographer:     L Thornton-Maynard Referring Phys:  [id number] Eugenie Norrie Diagnosing Phys: Kristeen Miss MD IMPRESSIONS  1. Left ventricular ejection fraction, by estimation, is 55 to 60%. The left ventricle has normal function. The left ventricle has no regional wall motion abnormalities. Left ventricular diastolic parameters are indeterminate.  2. Right ventricular systolic function is normal. The right ventricular size is normal. There is moderately elevated pulmonary  artery systolic pressure.  3. The mitral valve is normal in structure. Mild mitral valve regurgitation.  4. Tricuspid valve regurgitation is moderate.  5. The aortic valve is normal in structure. Aortic valve regurgitation is not visualized. FINDINGS  Left Ventricle: Left ventricular ejection fraction, by estimation, is 55 to 60%. The left ventricle has normal function. The left ventricle has no regional wall motion abnormalities. The left ventricular internal cavity size was normal in size. There is  no left ventricular hypertrophy. Left ventricular diastolic parameters are indeterminate. Right Ventricle:  The right ventricular size is normal. No increase in right ventricular wall thickness. Right ventricular systolic function is normal. There is moderately elevated pulmonary artery systolic pressure. The tricuspid regurgitant velocity is 2.82 m/s, and with an assumed right atrial pressure of 15 mmHg, the estimated right ventricular systolic pressure is 46.8 mmHg. Left Atrium: Left atrial size was normal in size. Right Atrium: Right atrial size was normal in size. Pericardium: There is no evidence of pericardial effusion. Mitral Valve: The mitral valve is normal in structure. Mild mitral valve regurgitation. Tricuspid Valve: The tricuspid valve is grossly normal. Tricuspid valve regurgitation is moderate. Aortic Valve: The aortic valve is normal in structure. Aortic valve regurgitation is not visualized. Aortic regurgitation PHT measures 600 msec. Aortic valve mean gradient measures 5.0 mmHg. Aortic valve peak gradient measures 10.5 mmHg. Aortic valve area, by VTI measures 1.63 cm. Pulmonic Valve: The pulmonic valve was not well visualized. Pulmonic valve regurgitation is not visualized. Aorta: The aortic root and ascending aorta are structurally normal, with no evidence of dilitation. IAS/Shunts: The atrial septum is grossly normal.  LEFT VENTRICLE PLAX 2D LVIDd:         4.20 cm  Diastology LVIDs:         3.49 cm  LV e' medial:    8.49 cm/s LV PW:         0.67 cm  LV E/e' medial:  10.3 LV IVS:        0.59 cm  LV e' lateral:   10.40 cm/s LVOT diam:     2.00 cm  LV E/e' lateral: 8.4 LV SV:         48 LV SV Index:   28 LVOT Area:     3.14 cm  RIGHT VENTRICLE RV S prime:     9.90 cm/s TAPSE (M-mode): 2.0 cm LEFT ATRIUM             Index LA diam:        2.70 cm 1.54 cm/m LA Vol (A2C):   49.9 ml 28.37 ml/m LA Vol (A4C):   30.8 ml 17.51 ml/m LA Biplane Vol: 39.0 ml 22.18 ml/m  AORTIC VALVE AV Area (Vmax):    1.48 cm AV Area (Vmean):   1.70 cm AV Area (VTI):     1.63 cm AV Vmax:           162.00 cm/s AV Vmean:           101.000 cm/s AV VTI:            0.296 m AV Peak Grad:      10.5 mmHg AV Mean Grad:      5.0 mmHg LVOT Vmax:         76.10 cm/s LVOT Vmean:        54.800 cm/s LVOT VTI:          0.154 m LVOT/AV VTI ratio: 0.52 AI PHT:  600 msec  AORTA Ao Root diam: 2.40 cm MITRAL VALVE               TRICUSPID VALVE MV Area (PHT): 3.01 cm    TR Peak grad:   31.8 mmHg MV E velocity: 87.55 cm/s  TR Vmax:        282.00 cm/s MV A velocity: 55.80 cm/s MV E/A ratio:  1.57        SHUNTS                            Systemic VTI:  0.15 m                            Systemic Diam: 2.00 cm Kristeen Miss MD Electronically signed by Kristeen Miss MD Signature Date/Time: 07/19/2020/2:05:19 PM    Final     Cardiac Studies      Patient Profile     74 y.o. femalewith altered mental status, bradycardia , hypothermia   Assessment & Plan    1.   Bradycardia. :   This has resolved.   Pacer was pulled yesterday  Nurse will pull venous sheath today   2.  Altered mental status: neuro is on board.   [facility] will sign off.   Medication Recommendations:  Cont supportive care  Other recommendations (labs, testing, etc):   Follow up as an outpatient:  With her medical doctor.   She does not need cardiology follow up      For questions or updates, please contact [facility] Please consult [email or web address] for contact info under        Signed, Kristeen Miss, MD  07/20/2020, 9:03 AM

## 2020-07-20 NOTE — Progress Notes (Signed)
Called bedside: patient appears restless/agitated, HR 47-55 & unable to obtain reliable SpO2. BIPAP: 100% FiO2. Acute Respiratory Failure vs Metabolic Acidosis with Encephalopathy  - STAT ABG, initial mixed venous- difficult stick, will retry - STAT CXR - called daughter to discuss baseline mentation, NO reports of sun-downing behavior. Updated daughter on plan of care and multifactorial reasons for agitation. Will continue to support patient, high risk for intubation due to maximal settings on Bipap.   Cheryll Cockayne Rust-Chester, AGACNP-BC Acute Care Nurse Practitioner [facility]   [phone] / [phone] Please see Amion for pager details.

## 2020-07-20 NOTE — Progress Notes (Signed)
CRITICAL CARE PROGRESS NOTE    Name: Julie Roberts MRN: [id number] DOB: 05-Apr-1946     LOS: 2   SUBJECTIVE FINDINGS & SIGNIFICANT EVENTS    Patient description:   74 yo F PMH as below, daughter gives history as patient is with encephalopathy.  Daughter states despite significant comorbid history she was able to drive 1 month ago and does all ADLs on her own.  She has been unsteady on feet with disequilibrium over past 2 months and she has had MRI brian for possible CVA on outpatient.  She was brought to ED due to worsening dizziness and altered mentation and found to have profound bradycardia in the teens.She had transcutaneous pacing placed. She was emergently seen by cardiology who was able to place transvenous TPM.  She was also noted to have A/CKD and hypothermia with admission to MICU for additional evaluation and management.    07/19/20- patient is improved.  I spoke with Dr Acie Fredrickson regarding cardiology plan-TPM removed from [facility]. Daughter at bedside reviewed care plan. Neurology consult for confusion.   07/20/20- patient on BIPAP.  Plan to wean precedex today and remove NIV.  Met with daughter of patient [name] and [name].    Lines/tubes :   Microbiology/Sepsis markers: Results for orders placed or performed during the hospital encounter of 07/02/2020  Blood culture (routine single)     Status: None (Preliminary result)   Collection Time: 07/15/2020  3:37 PM   Specimen: BLOOD  Result Value Ref Range Status   Specimen Description BLOOD BLOOD LEFT HAND  Final   Special Requests   Final    BOTTLES DRAWN AEROBIC AND ANAEROBIC Blood Culture results may not be optimal due to an inadequate volume of blood received in culture bottles   Culture   Final    NO GROWTH 2 DAYS Performed at [facility], [address] [place], [place] [address]    Report Status PENDING  Incomplete  Resp Panel by RT-PCR (Flu A&B, Covid) Nasopharyngeal Swab     Status: None   Collection Time: 07/12/2020  4:00 PM   Specimen: Nasopharyngeal Swab; Nasopharyngeal(NP) swabs in vial transport medium  Result Value Ref Range Status   SARS Coronavirus 2 by RT PCR NEGATIVE NEGATIVE Final    Comment: (NOTE) SARS-CoV-2 target nucleic acids are NOT DETECTED.  The SARS-CoV-2 RNA is generally detectable in upper respiratory specimens during the acute phase of infection. The lowest concentration of SARS-CoV-2 viral copies this assay can detect is 138 copies/mL. A negative result does not preclude SARS-Cov-2 infection and should not be used as the sole basis for treatment or other patient management decisions. A negative result may occur with  improper specimen collection/handling, submission of specimen other than nasopharyngeal swab, presence of viral mutation(s) within the areas targeted by this assay, and inadequate number of viral copies(<138 copies/mL). A negative result must be combined with clinical observations, patient history, and epidemiological information. The expected result is Negative.  Fact Sheet for Patients:  EntrepreneurPulse.com.au  Fact Sheet for Healthcare Providers:  IncredibleEmployment.be  This test is no t yet approved or cleared by the [place] FDA and  has been authorized for detection and/or diagnosis of SARS-CoV-2 by FDA under an Emergency Use Authorization (EUA). This EUA will remain  in effect (meaning this test can be used) for the duration of the COVID-19 declaration under Section 564(b)(1) of the Act, 21 U.S.C.section 360bbb-3(b)(1), unless the authorization is terminated  or revoked sooner.  Influenza A by PCR NEGATIVE NEGATIVE Final   Influenza B by PCR NEGATIVE NEGATIVE Final    Comment: (NOTE) The Xpert Xpress SARS-CoV-2/FLU/RSV  plus assay is intended as an aid in the diagnosis of influenza from Nasopharyngeal swab specimens and should not be used as a sole basis for treatment. Nasal washings and aspirates are unacceptable for Xpert Xpress SARS-CoV-2/FLU/RSV testing.  Fact Sheet for Patients: EntrepreneurPulse.com.au  Fact Sheet for Healthcare Providers: IncredibleEmployment.be  This test is not yet approved or cleared by the [place] FDA and has been authorized for detection and/or diagnosis of SARS-CoV-2 by FDA under an Emergency Use Authorization (EUA). This EUA will remain in effect (meaning this test can be used) for the duration of the COVID-19 declaration under Section 564(b)(1) of the Act, 21 U.S.C. section 360bbb-3(b)(1), unless the authorization is terminated or revoked.  Performed at [facility], [address] [place], [place] [address]   MRSA PCR Screening     Status: None   Collection Time: 07/08/2020  5:47 PM   Specimen: Nasopharyngeal  Result Value Ref Range Status   MRSA by PCR NEGATIVE NEGATIVE Final    Comment:        The GeneXpert MRSA Assay (FDA approved for NASAL specimens only), is one component of a comprehensive MRSA colonization surveillance program. It is not intended to diagnose MRSA infection nor to guide or monitor treatment for MRSA infections. Performed at [facility], [address], [place], [place] [address]   Urine culture     Status: Abnormal (Preliminary result)   Collection Time: 07/19/20  4:10 AM   Specimen: In/Out Cath Urine  Result Value Ref Range Status   Specimen Description   Final    IN/OUT CATH URINE Performed at [facility], [address], [place], [place] [address]    Special Requests   Final    NONE Performed at [facility], [address], [place], [place] [address]    Culture (A)  Final    60,000 COLONIES/mL ENTEROBACTER AEROGENES SUSCEPTIBILITIES TO  FOLLOW Performed at [facility], [place] [address], [place], [place] [address]    Report Status PENDING  Incomplete    Anti-infectives:  Anti-infectives (From admission, onward)   Start     Dose/Rate Route Frequency Ordered Stop   07/19/20 0500  doxycycline (VIBRAMYCIN) 100 mg in sodium chloride 0.9 % 250 mL IVPB  Status:  Discontinued        100 mg 125 mL/hr over 120 Minutes Intravenous Every 12 hours 07/19/20 0357 07/20/20 1112       Consults: Treatment Team:  Murlean Iba, MD Leotis Pain, MD      PAST MEDICAL HISTORY   Past Medical History:  Diagnosis Date  . Asthma   . Hypertension      SURGICAL HISTORY   Past Surgical History:  Procedure Laterality Date  . BREAST BIOPSY Left 2013   neg     FAMILY HISTORY   Family History  Problem Relation Age of Onset  . Breast cancer Neg Hx      SOCIAL HISTORY       MEDICATIONS   Current Medication:  Current Facility-Administered Medications:  .  Chlorhexidine Gluconate Cloth 2 % PADS 6 each, 6 each, Topical, Q0600, Ottie Glazier, MD, 6 each at 07/20/20 0430 .  dextrose 10 % infusion, , Intravenous, Continuous, Eliyahu Bille, MD, Last Rate: 30 mL/hr at 07/20/20 0809, Infusion Verify at 07/20/20 0809 .  heparin sodium (porcine) injection  1,000-6,000 Units, 1,000-6,000 Units, Intravenous, PRN, Awilda Bill, NP .  norepinephrine (LEVOPHED) 16 mg in 211m premix infusion, 0-40 mcg/min, Intravenous, Titrated, BAwilda Bill NP, Held at 07/19/20 [phone]   ALLERGIES   Penicillins and Ace inhibitors    REVIEW OF SYSTEMS    Unable to obtain due to encephalopathy  PHYSICAL EXAMINATION   Vital Signs: Temp:  [95 F (35 C)-100.04 F (37.8 C)] 96.8 F (36 C) (12/26 1100) Pulse Rate:  [31-100] 86 (12/26 1100) Resp:  [14-27] 17 (12/26 1100) BP: (87-133)/(47-97) 109/62 (12/26 1100) SpO2:  [62 %-100 %] 100 % (12/26 1100) FiO2 (%):  [80 %-100 %] 80 % (12/26 0400) Weight:  [76.3 kg] 76.3  kg (12/26 0500)  GENERAL:age appropriate HEAD: Normocephalic, atraumatic.  EYES: Pupils equal, round, reactive to light.  No scleral icterus.  MOUTH: Moist mucosal membrane. NECK: Supple. No thyromegaly. No nodules. No JVD.  PULMONARY: decreased air entry bilaterally  CARDIOVASCULAR: S1 and S2. Regular rate and rhythm. No murmurs, rubs, or gallops.  GASTROINTESTINAL: Soft, nontender, non-distended. No masses. Positive bowel sounds. No hepatosplenomegaly.  MUSCULOSKELETAL: No swelling, clubbing, or edema.  NEUROLOGIC: GCS10 SKIN:intact,warm,dry   PERTINENT DATA     Infusions: . dextrose 30 mL/hr at 07/20/20 0809  . norepinephrine (LEVOPHED) Adult infusion Stopped (07/19/20 0557)   Scheduled Medications: . Chlorhexidine Gluconate Cloth  6 each Topical Q0600   PRN Medications:  Hemodynamic parameters:   Intake/Output: 12/25 0701 - 12/26 0700 In: 2106.9 [I.V.:1436.2; IV Piggyback:670.7] Out: 3200 [[FAOZH:0865] Ventilator  Settings: FiO2 (%):  [80 %-100 %] 80 %   LAB RESULTS:  Basic Metabolic Panel: Recent Labs  Lab 07/22/2020 1600 07/21/2020 2319 07/19/20 0426 07/19/20 1405 07/19/20 1527 07/19/20 2021 07/20/20 0416  NA 131* 133* 132* 135  --   --  141  K 5.5* 5.5* 4.4 6.3*   < > 5.7* 5.0  CL 98 103 102 103  --   --  106  CO2 20* 20* 19* 19*  --   --  26  GLUCOSE 128* 121* 118* 119*  --   --  98  BUN 56* 55* 57* 58*  --   --  54*  CREATININE 3.31* 3.49* 3.74* 3.75*  --   --  3.17*  CALCIUM 10.4* 9.7 8.9 9.2  --   --  8.8*  MG  --   --  1.9  --   --   --  1.8  PHOS  --   --  1.8* 4.8*  --   --  4.3   < > = values in this interval not displayed.   Liver Function Tests: Recent Labs  Lab 07/20/2020 1600 07/19/20 0426 07/20/20 0416  AST 61*  --   --   ALT 56*  --   --   ALKPHOS 152*  --   --   BILITOT 0.5  --   --   PROT 6.2*  --   --   ALBUMIN 3.1* 2.4* 2.4*   No results for input(s): LIPASE, AMYLASE in the last 168 hours. Recent Labs  Lab 07/19/20 2025   AMMONIA 16   CBC: Recent Labs  Lab 07/13/2020 1600 07/19/20 0426 07/20/20 0416  WBC 11.4* 7.3 8.7  NEUTROABS 8.5* 6.5  --   HGB 8.6* 8.8* 8.7*  HCT 25.3* 26.6* 24.2*  MCV 92.3 93.7 88.0  PLT 142* 152 101*   Cardiac Enzymes: No results for input(s): CKTOTAL, CKMB, CKMBINDEX, TROPONINI in the last 168 hours. BNP: Invalid  input(s): POCBNP CBG: Recent Labs  Lab 07/19/20 1941 07/19/20 2349 07/20/20 0302 07/20/20 0751 07/20/20 1109  GLUCAP 121* 116* 87 94 80       IMAGING RESULTS:  Imaging: CARDIAC CATHETERIZATION  Result Date: 06/25/2020 Successful transvenous pacemaker placement via the right common femoral vein. Recommendations: During pacemaker placement, the patient was noted to have some intrinsic rhythm with heart rate in the high 40s and low 50s.  Thus, I elected to start the pacemaker rate at 40 bpm to allow intrinsic rhythm.  The patient's hypotension improved with IV fluids and dopamine. Recommend admission to the intensive care unit for further work-up of hypothermia, anemia and acute renal failure. Continue to hold all antihypertensive medications including metoprolol which is the likely culprit for bradycardia in the setting of acute renal failure with subsequent accumulation. I suspect that the patient's most likely will not require permanent pacemaker.  Once we know that her bradycardia has resolved, the temporary pacemaker can be removed.  DG Chest Port 1 View  Result Date: 07/19/2020 CLINICAL DATA:  74 year old female with shortness of breath EXAM: PORTABLE CHEST 1 VIEW COMPARISON:  Chest radiograph dated 07/19/2020. FINDINGS: Interval removal of the pacer wire. Right IJ dialysis catheter with tip at the cavoatrial junction. Bilateral pulmonary opacities, right greater left, improved since the prior radiograph. No pleural effusion or pneumothorax. Stable cardiac silhouette. Atherosclerotic calcification of the aorta. No acute osseous pathology. IMPRESSION:  Slight interval improvement of bilateral airspace opacities. Electronically Signed   By: Anner Crete M.D.   On: 07/19/2020 20:25   DG Chest Port 1 View  Result Date: 07/19/2020 CLINICAL DATA:  Central line placement EXAM: PORTABLE CHEST 1 VIEW COMPARISON:  Earlier today FINDINGS: New dialysis catheter on the right with tip at the upper cavoatrial junction. No pneumothorax. Asymmetric extensive airspace disease on the right more than left. No visible pleural effusion. Cardiomegaly. There is a temporary pacer from below which has a more vertical position today. Remote mid left clavicle fracture. IMPRESSION: 1. New dialysis catheter without complicating feature. 2. Temporary pacer from below with more vertical positioning of the distal lead, but still likely overlapping the right ventricle. 3. Stable airspace disease. Electronically Signed   By: Monte Fantasia M.D.   On: 07/19/2020 05:54   DG Chest Port 1 View  Result Date: 07/19/2020 CLINICAL DATA:  Respiratory failure. EXAM: PORTABLE CHEST 1 VIEW COMPARISON:  07/20/2020 FINDINGS: 0350 hours. The cardio pericardial silhouette is enlarged. Bilateral airspace disease, right greater than left, is similar to prior although progressive in the right base. No pleural effusion. Old left clavicle fracture noted. Telemetry leads overlie the chest. IMPRESSION: Asymmetric right greater than left airspace disease, similar to prior although it is progressive at the right base. Electronically Signed   By: Misty Stanley M.D.   On: 07/19/2020 04:03   DG Chest Port 1 View  Result Date: 07/21/2020 CLINICAL DATA:  74 year old female status post pacemaker placement. EXAM: PORTABLE CHEST 1 VIEW COMPARISON:  Chest radiograph dated 07/07/2020. FINDINGS: There is cardiomegaly with vascular congestion and probable mild edema. Patchy area of density in the left mid lung field may represent edema or pneumonia. No pleural effusion pneumothorax. No acute osseous pathology. A  pacer wire noted with tip over the spine. IMPRESSION: Mild cardiomegaly and mild vascular congestion. Patchy density in the left mid lung field may represent edema or pneumonia. No pneumothorax. Electronically Signed   By: Anner Crete M.D.   On: 07/22/2020 18:06   DG Chest [facility]  1 View  Result Date: 07/03/2020 CLINICAL DATA:  Questionable sepsis. EXAM: PORTABLE CHEST 1 VIEW COMPARISON:  None. FINDINGS: The heart size is enlarged. Evaluation of the left lung field is limited by an overlying defibrillator pad. There appear to be diffuse hazy airspace opacities in the left lung field and to a lesser degree the right. Kerley B lines are noted. There are probable trace bilateral pleural effusions. There is no pneumothorax. There is an old left clavicle fracture. No definite acute displaced fracture. IMPRESSION: 1. Cardiomegaly with pulmonary edema. 2. Probable trace bilateral pleural effusions. 3. Hazy airspace opacities, left greater than right, may represent an atypical infectious process or developing pulmonary edema. Electronically Signed   By: Constance Holster M.D.   On: 07/03/2020 16:14   ECHOCARDIOGRAM COMPLETE  Result Date: 07/19/2020    ECHOCARDIOGRAM REPORT   Patient Name:   YANNI RUBERG Date of Exam: 07/19/2020 Medical Rec #:  [id number]       Height:       63.0 in Accession #:    [id number]      Weight:       160.0 lb Date of Birth:  03-30-1946        BSA:          1.759 m Patient Age:    34 years        BP:           104/42 mmHg Patient Gender: F               HR:           49 bpm. Exam Location:  [facility] Procedure: 2D Echo Indications:     Acute Respiratory Distress  History:         Patient has no prior history of Echocardiogram examinations.                  Stroke; Risk Factors:Hypertension.  Sonographer:     L Thornton-Maynard Referring Phys:  [id number] Awilda Bill Diagnosing Phys: Mertie Moores MD IMPRESSIONS  1. Left ventricular ejection fraction, by estimation, is 55 to 60%. The left  ventricle has normal function. The left ventricle has no regional wall motion abnormalities. Left ventricular diastolic parameters are indeterminate.  2. Right ventricular systolic function is normal. The right ventricular size is normal. There is moderately elevated pulmonary artery systolic pressure.  3. The mitral valve is normal in structure. Mild mitral valve regurgitation.  4. Tricuspid valve regurgitation is moderate.  5. The aortic valve is normal in structure. Aortic valve regurgitation is not visualized. FINDINGS  Left Ventricle: Left ventricular ejection fraction, by estimation, is 55 to 60%. The left ventricle has normal function. The left ventricle has no regional wall motion abnormalities. The left ventricular internal cavity size was normal in size. There is  no left ventricular hypertrophy. Left ventricular diastolic parameters are indeterminate. Right Ventricle: The right ventricular size is normal. No increase in right ventricular wall thickness. Right ventricular systolic function is normal. There is moderately elevated pulmonary artery systolic pressure. The tricuspid regurgitant velocity is 2.82 m/s, and with an assumed right atrial pressure of 15 mmHg, the estimated right ventricular systolic pressure is 97.0 mmHg. Left Atrium: Left atrial size was normal in size. Right Atrium: Right atrial size was normal in size. Pericardium: There is no evidence of pericardial effusion. Mitral Valve: The mitral valve is normal in structure. Mild mitral valve regurgitation. Tricuspid Valve: The tricuspid valve is grossly normal. Tricuspid valve regurgitation is moderate.  Aortic Valve: The aortic valve is normal in structure. Aortic valve regurgitation is not visualized. Aortic regurgitation PHT measures 600 msec. Aortic valve mean gradient measures 5.0 mmHg. Aortic valve peak gradient measures 10.5 mmHg. Aortic valve area, by VTI measures 1.63 cm. Pulmonic Valve: The pulmonic valve was not well visualized.  Pulmonic valve regurgitation is not visualized. Aorta: The aortic root and ascending aorta are structurally normal, with no evidence of dilitation. IAS/Shunts: The atrial septum is grossly normal.  LEFT VENTRICLE PLAX 2D LVIDd:         4.20 cm  Diastology LVIDs:         3.49 cm  LV e' medial:    8.49 cm/s LV PW:         0.67 cm  LV E/e' medial:  10.3 LV IVS:        0.59 cm  LV e' lateral:   10.40 cm/s LVOT diam:     2.00 cm  LV E/e' lateral: 8.4 LV SV:         48 LV SV Index:   28 LVOT Area:     3.14 cm  RIGHT VENTRICLE RV S prime:     9.90 cm/s TAPSE (M-mode): 2.0 cm LEFT ATRIUM             Index LA diam:        2.70 cm 1.54 cm/m LA Vol (A2C):   49.9 ml 28.37 ml/m LA Vol (A4C):   30.8 ml 17.51 ml/m LA Biplane Vol: 39.0 ml 22.18 ml/m  AORTIC VALVE AV Area (Vmax):    1.48 cm AV Area (Vmean):   1.70 cm AV Area (VTI):     1.63 cm AV Vmax:           162.00 cm/s AV Vmean:          101.000 cm/s AV VTI:            0.296 m AV Peak Grad:      10.5 mmHg AV Mean Grad:      5.0 mmHg LVOT Vmax:         76.10 cm/s LVOT Vmean:        54.800 cm/s LVOT VTI:          0.154 m LVOT/AV VTI ratio: 0.52 AI PHT:            600 msec  AORTA Ao Root diam: 2.40 cm MITRAL VALVE               TRICUSPID VALVE MV Area (PHT): 3.01 cm    TR Peak grad:   31.8 mmHg MV E velocity: 87.55 cm/s  TR Vmax:        282.00 cm/s MV A velocity: 55.80 cm/s MV E/A ratio:  1.57        SHUNTS                            Systemic VTI:  0.15 m                            Systemic Diam: 2.00 cm Mertie Moores MD Electronically signed by Mertie Moores MD Signature Date/Time: 07/19/2020/2:05:19 PM    Final    _0 @ DG Chest Port 1 View  Result Date: 07/19/2020 CLINICAL DATA:  74 year old female with shortness of breath EXAM: PORTABLE CHEST 1 VIEW COMPARISON:  Chest radiograph dated 07/19/2020. FINDINGS: Interval removal of the  pacer wire. Right IJ dialysis catheter with tip at the cavoatrial junction. Bilateral pulmonary opacities, right greater left,  improved since the prior radiograph. No pleural effusion or pneumothorax. Stable cardiac silhouette. Atherosclerotic calcification of the aorta. No acute osseous pathology. IMPRESSION: Slight interval improvement of bilateral airspace opacities. Electronically Signed   By: Anner Crete M.D.   On: 07/19/2020 20:25     ASSESSMENT AND PLAN    -Multidisciplinary rounds held today  Symptomatic bradyarrythmia   Cardiology on case - Dr Fletcher Anon - appreciate input - likely due to CKD with reaccumulation of BB -please follow cardiology recommendations   Acute on chronic renal failure  - d/c nonessential nephrotoxins ICU monitoring -follow chem 7 -follow UO -continue Foley Catheter-assess need daily   Altered mental status with encephalopathy    -patient is at risk of inability to protect airway    - likely due to toxic metabolic encephalopathy Wake up assessment pending    -neurology consultation - Dr Earnest Conroy - appreciate input  ID -continue IV abx as prescibed -follow up cultures  GI/Nutrition GI PROPHYLAXIS as indicated DIET-->TF's as tolerated Constipation protocol as indicated  ENDO - ICU hypoglycemic\Hyperglycemia protocol -check FSBS per protocol   ELECTROLYTES -follow labs as needed -replace as needed -pharmacy consultation   DVT/GI PRX ordered -SCDs  TRANSFUSIONS AS NEEDED MONITOR FSBS ASSESS the need for LABS as needed   Critical care provider statement:    Critical care time (minutes):  33   Critical care time was exclusive of:  Separately billable procedures and treating other patients   Critical care was necessary to treat or prevent imminent or life-threatening deterioration of the following conditions:  altered mental status, encephalopathy, symptomatic bradyarrythmia, Acute on chronic renal failure, multiple comorbid conditions.   Critical care was time spent personally by me on the following activities:  Development of treatment plan with patient or  surrogate, discussions with consultants, evaluation of patient's response to treatment, examination of patient, obtaining history from patient or surrogate, ordering and performing treatments and interventions, ordering and review of laboratory studies and re-evaluation of patient's condition.  I assumed direction of critical care for this patient from another provider in my specialty: no    This document was prepared using Dragon voice recognition software and may include unintentional dictation errors.    Ottie Glazier, M.D.  Division of [place]

## 2020-07-20 NOTE — Progress Notes (Signed)
Patient trialed off of bipap on 6L [place], but saturations dropped into the 70's and patient became more agitated. Bipap Resumed at 12/6 and 60%. Dr. Karna Christmas aware.

## 2020-07-20 NOTE — Progress Notes (Signed)
[facility], [place] 07/20/20  Subjective:   LOS: 2 12/25 0701 - 12/26 0700 In: 2106.9 [I.V.:1436.2; IV Piggyback:670.7] Out: 3200 [Urine:3200]  Remains on biPAP Nurse reports patient has been agitated and requiring sedation Soft mittens in place   Objective:  Vital signs in last 24 hours:  Temp:  [95 F (35 C)-100.04 F (37.8 C)] 96.62 F (35.9 C) (12/26 0800) Pulse Rate:  [31-100] 88 (12/26 0800) Resp:  [14-23] 20 (12/26 0800) BP: (87-133)/(41-97) 118/55 (12/26 0800) SpO2:  [62 %-100 %] 100 % (12/26 0800) FiO2 (%):  [80 %-100 %] 80 % (12/26 0400) Weight:  [76.3 kg] 76.3 kg (12/26 0500)  Weight change: 3.725 kg Filed Weights   August 07, 2020 1620 07/20/20 0500  Weight: 72.6 kg 76.3 kg    Intake/Output:    Intake/Output Summary (Last 24 hours) at 07/20/2020 0943 Last data filed at 07/20/2020 0809 Gross per 24 hour  Intake 2325 ml  Output 3450 ml  Net -1125 ml     Physical Exam: General: critically ill appearing  HEENT biPAP mask in place  Pulm/lungs Requiring NIPPV; b/l diffuse crackles  CVS/Heart regular  Abdomen:  soft  Extremities: Trace edema  Neurologic: sedated  Skin: No acute rashes  Access: Rt IJ temp cath   Foley in place    Basic Metabolic Panel:  Recent Labs  Lab 08/07/20 1600 08/07/20 2319 07/19/20 0426 07/19/20 1405 07/19/20 1527 07/19/20 2021 07/20/20 0416  NA 131* 133* 132* 135  --   --  141  K 5.5* 5.5* 4.4 6.3* 5.9* 5.7* 5.0  CL 98 103 102 103  --   --  106  CO2 20* 20* 19* 19*  --   --  26  GLUCOSE 128* 121* 118* 119*  --   --  98  BUN 56* 55* 57* 58*  --   --  54*  CREATININE 3.31* 3.49* 3.74* 3.75*  --   --  3.17*  CALCIUM 10.4* 9.7 8.9 9.2  --   --  8.8*  MG  --   --  1.9  --   --   --  1.8  PHOS  --   --  1.8* 4.8*  --   --  4.3     CBC: Recent Labs  Lab 08/07/20 1600 07/19/20 0426 07/20/20 0416  WBC 11.4* 7.3 8.7  NEUTROABS 8.5* 6.5  --   HGB 8.6* 8.8* 8.7*  HCT 25.3* 26.6* 24.2*   MCV 92.3 93.7 88.0  PLT 142* 152 101*     No results found for: HEPBSAG, HEPBSAB, HEPBIGM    Microbiology:  Recent Results (from the past 240 hour(s))  Blood culture (routine single)     Status: None (Preliminary result)   Collection Time: 2020-08-07  3:37 PM   Specimen: BLOOD  Result Value Ref Range Status   Specimen Description BLOOD BLOOD LEFT HAND  Final   Special Requests   Final    BOTTLES DRAWN AEROBIC AND ANAEROBIC Blood Culture results may not be optimal due to an inadequate volume of blood received in culture bottles   Culture   Final    NO GROWTH 2 DAYS Performed at [facility], [address] [place], [place] [address]    Report Status PENDING  Incomplete  Resp Panel by RT-PCR (Flu A&B, Covid) Nasopharyngeal Swab     Status: None   Collection Time: 08/07/2020  4:00 PM   Specimen: Nasopharyngeal Swab; Nasopharyngeal(NP) swabs in vial transport medium  Result Value  Ref Range Status   SARS Coronavirus 2 by RT PCR NEGATIVE NEGATIVE Final    Comment: (NOTE) SARS-CoV-2 target nucleic acids are NOT DETECTED.  The SARS-CoV-2 RNA is generally detectable in upper respiratory specimens during the acute phase of infection. The lowest concentration of SARS-CoV-2 viral copies this assay can detect is 138 copies/mL. A negative result does not preclude SARS-Cov-2 infection and should not be used as the sole basis for treatment or other patient management decisions. A negative result may occur with  improper specimen collection/handling, submission of specimen other than nasopharyngeal swab, presence of viral mutation(s) within the areas targeted by this assay, and inadequate number of viral copies(<138 copies/mL). A negative result must be combined with clinical observations, patient history, and epidemiological information. The expected result is Negative.  Fact Sheet for Patients:  BloggerCourse.com  Fact Sheet for Healthcare Providers:   SeriousBroker.it  This test is no t yet approved or cleared by the [place] FDA and  has been authorized for detection and/or diagnosis of SARS-CoV-2 by FDA under an Emergency Use Authorization (EUA). This EUA will remain  in effect (meaning this test can be used) for the duration of the COVID-19 declaration under Section 564(b)(1) of the Act, 21 U.S.C.section 360bbb-3(b)(1), unless the authorization is terminated  or revoked sooner.       Influenza A by PCR NEGATIVE NEGATIVE Final   Influenza B by PCR NEGATIVE NEGATIVE Final    Comment: (NOTE) The Xpert Xpress SARS-CoV-2/FLU/RSV plus assay is intended as an aid in the diagnosis of influenza from Nasopharyngeal swab specimens and should not be used as a sole basis for treatment. Nasal washings and aspirates are unacceptable for Xpert Xpress SARS-CoV-2/FLU/RSV testing.  Fact Sheet for Patients: BloggerCourse.com  Fact Sheet for Healthcare Providers: SeriousBroker.it  This test is not yet approved or cleared by the [place] FDA and has been authorized for detection and/or diagnosis of SARS-CoV-2 by FDA under an Emergency Use Authorization (EUA). This EUA will remain in effect (meaning this test can be used) for the duration of the COVID-19 declaration under Section 564(b)(1) of the Act, 21 U.S.C. section 360bbb-3(b)(1), unless the authorization is terminated or revoked.  Performed at [facility], [address] [place], [place] [address]   MRSA PCR Screening     Status: None   Collection Time: 08-02-20  5:47 PM   Specimen: Nasopharyngeal  Result Value Ref Range Status   MRSA by PCR NEGATIVE NEGATIVE Final    Comment:        The GeneXpert MRSA Assay (FDA approved for NASAL specimens only), is one component of a comprehensive MRSA colonization surveillance program. It is not intended to diagnose MRSA infection nor to guide  or monitor treatment for MRSA infections. Performed at [facility], [address] [place], [place] [address]   Urine culture     Status: Abnormal (Preliminary result)   Collection Time: 07/19/20  4:10 AM   Specimen: In/Out Cath Urine  Result Value Ref Range Status   Specimen Description   Final    IN/OUT CATH URINE Performed at [facility], [address] [place], [place] [address]    Special Requests   Final    NONE Performed at [facility], [address] [place], [place] [address]    Culture (A)  Final    60,000 COLONIES/mL Romie Minus NEGATIVE RODS SUSCEPTIBILITIES TO FOLLOW Performed at [facility], [address], [place], [place] [address]    Report  Status PENDING  Incomplete    Coagulation Studies: Recent Labs    07/22/2020 1600  LABPROT 12.6  INR 1.0    Urinalysis: Recent Labs    07/19/20 0410  COLORURINE AMBER*  LABSPEC 1.020  PHURINE 5.0  GLUCOSEU NEGATIVE  HGBUR NEGATIVE  BILIRUBINUR NEGATIVE  KETONESUR NEGATIVE  PROTEINUR 100*  NITRITE NEGATIVE  LEUKOCYTESUR NEGATIVE      Imaging: CARDIAC CATHETERIZATION  Result Date: 06/25/2020 Successful transvenous pacemaker placement via the right common femoral vein. Recommendations: During pacemaker placement, the patient was noted to have some intrinsic rhythm with heart rate in the high 40s and low 50s.  Thus, I elected to start the pacemaker rate at 40 bpm to allow intrinsic rhythm.  The patient's hypotension improved with IV fluids and dopamine. Recommend admission to the intensive care unit for further work-up of hypothermia, anemia and acute renal failure. Continue to hold all antihypertensive medications including metoprolol which is the likely culprit for bradycardia in the setting of acute renal failure with subsequent accumulation. I suspect that the patient's most likely will not require permanent pacemaker.  Once we know that her bradycardia has resolved, the  temporary pacemaker can be removed.  DG Chest Port 1 View  Result Date: 07/19/2020 CLINICAL DATA:  74 year old female with shortness of breath EXAM: PORTABLE CHEST 1 VIEW COMPARISON:  Chest radiograph dated 07/19/2020. FINDINGS: Interval removal of the pacer wire. Right IJ dialysis catheter with tip at the cavoatrial junction. Bilateral pulmonary opacities, right greater left, improved since the prior radiograph. No pleural effusion or pneumothorax. Stable cardiac silhouette. Atherosclerotic calcification of the aorta. No acute osseous pathology. IMPRESSION: Slight interval improvement of bilateral airspace opacities. Electronically Signed   By: Elgie Collard M.D.   On: 07/19/2020 20:25   DG Chest Port 1 View  Result Date: 07/19/2020 CLINICAL DATA:  Central line placement EXAM: PORTABLE CHEST 1 VIEW COMPARISON:  Earlier today FINDINGS: New dialysis catheter on the right with tip at the upper cavoatrial junction. No pneumothorax. Asymmetric extensive airspace disease on the right more than left. No visible pleural effusion. Cardiomegaly. There is a temporary pacer from below which has a more vertical position today. Remote mid left clavicle fracture. IMPRESSION: 1. New dialysis catheter without complicating feature. 2. Temporary pacer from below with more vertical positioning of the distal lead, but still likely overlapping the right ventricle. 3. Stable airspace disease. Electronically Signed   By: Marnee Spring M.D.   On: 07/19/2020 05:54   DG Chest Port 1 View  Result Date: 07/19/2020 CLINICAL DATA:  Respiratory failure. EXAM: PORTABLE CHEST 1 VIEW COMPARISON:  07/14/2020 FINDINGS: 0350 hours. The cardio pericardial silhouette is enlarged. Bilateral airspace disease, right greater than left, is similar to prior although progressive in the right base. No pleural effusion. Old left clavicle fracture noted. Telemetry leads overlie the chest. IMPRESSION: Asymmetric right greater than left airspace  disease, similar to prior although it is progressive at the right base. Electronically Signed   By: Kennith Center M.D.   On: 07/19/2020 04:03   DG Chest Port 1 View  Result Date: 06/26/2020 CLINICAL DATA:  74 year old female status post pacemaker placement. EXAM: PORTABLE CHEST 1 VIEW COMPARISON:  Chest radiograph dated 07/04/2020. FINDINGS: There is cardiomegaly with vascular congestion and probable mild edema. Patchy area of density in the left mid lung field may represent edema or pneumonia. No pleural effusion pneumothorax. No acute osseous pathology. A pacer wire noted with tip over the spine. IMPRESSION: Mild cardiomegaly and mild vascular  congestion. Patchy density in the left mid lung field may represent edema or pneumonia. No pneumothorax. Electronically Signed   By: Elgie Collard M.D.   On: 07/08/2020 18:06   DG Chest Port 1 View  Result Date: 07/14/2020 CLINICAL DATA:  Questionable sepsis. EXAM: PORTABLE CHEST 1 VIEW COMPARISON:  None. FINDINGS: The heart size is enlarged. Evaluation of the left lung field is limited by an overlying defibrillator pad. There appear to be diffuse hazy airspace opacities in the left lung field and to a lesser degree the right. Kerley B lines are noted. There are probable trace bilateral pleural effusions. There is no pneumothorax. There is an old left clavicle fracture. No definite acute displaced fracture. IMPRESSION: 1. Cardiomegaly with pulmonary edema. 2. Probable trace bilateral pleural effusions. 3. Hazy airspace opacities, left greater than right, may represent an atypical infectious process or developing pulmonary edema. Electronically Signed   By: Katherine Mantle M.D.   On: 07/07/2020 16:14   ECHOCARDIOGRAM COMPLETE  Result Date: 07/19/2020    ECHOCARDIOGRAM REPORT   Patient Name:   Julie Roberts Date of Exam: 07/19/2020 Medical Rec #:  [id number]       Height:       63.0 in Accession #:    [id number]      Weight:       160.0 lb Date of Birth:   [birth year]        BSA:          1.759 m Patient Age:    74 years        BP:           104/42 mmHg Patient Gender: F               HR:           49 bpm. Exam Location:  [facility] Procedure: 2D Echo Indications:     Acute Respiratory Distress  History:         Patient has no prior history of Echocardiogram examinations.                  Stroke; Risk Factors:Hypertension.  Sonographer:     L Thornton-Maynard Referring Phys:  [id number] Eugenie Norrie Diagnosing Phys: Kristeen Miss MD IMPRESSIONS  1. Left ventricular ejection fraction, by estimation, is 55 to 60%. The left ventricle has normal function. The left ventricle has no regional wall motion abnormalities. Left ventricular diastolic parameters are indeterminate.  2. Right ventricular systolic function is normal. The right ventricular size is normal. There is moderately elevated pulmonary artery systolic pressure.  3. The mitral valve is normal in structure. Mild mitral valve regurgitation.  4. Tricuspid valve regurgitation is moderate.  5. The aortic valve is normal in structure. Aortic valve regurgitation is not visualized. FINDINGS  Left Ventricle: Left ventricular ejection fraction, by estimation, is 55 to 60%. The left ventricle has normal function. The left ventricle has no regional wall motion abnormalities. The left ventricular internal cavity size was normal in size. There is  no left ventricular hypertrophy. Left ventricular diastolic parameters are indeterminate. Right Ventricle: The right ventricular size is normal. No increase in right ventricular wall thickness. Right ventricular systolic function is normal. There is moderately elevated pulmonary artery systolic pressure. The tricuspid regurgitant velocity is 2.82 m/s, and with an assumed right atrial pressure of 15 mmHg, the estimated right ventricular systolic pressure is 46.8 mmHg. Left Atrium: Left atrial size was normal in size. Right Atrium: Right atrial size was normal in  size. Pericardium: There is  no evidence of pericardial effusion. Mitral Valve: The mitral valve is normal in structure. Mild mitral valve regurgitation. Tricuspid Valve: The tricuspid valve is grossly normal. Tricuspid valve regurgitation is moderate. Aortic Valve: The aortic valve is normal in structure. Aortic valve regurgitation is not visualized. Aortic regurgitation PHT measures 600 msec. Aortic valve mean gradient measures 5.0 mmHg. Aortic valve peak gradient measures 10.5 mmHg. Aortic valve area, by VTI measures 1.63 cm. Pulmonic Valve: The pulmonic valve was not well visualized. Pulmonic valve regurgitation is not visualized. Aorta: The aortic root and ascending aorta are structurally normal, with no evidence of dilitation. IAS/Shunts: The atrial septum is grossly normal.  LEFT VENTRICLE PLAX 2D LVIDd:         4.20 cm  Diastology LVIDs:         3.49 cm  LV e' medial:    8.49 cm/s LV PW:         0.67 cm  LV E/e' medial:  10.3 LV IVS:        0.59 cm  LV e' lateral:   10.40 cm/s LVOT diam:     2.00 cm  LV E/e' lateral: 8.4 LV SV:         48 LV SV Index:   28 LVOT Area:     3.14 cm  RIGHT VENTRICLE RV S prime:     9.90 cm/s TAPSE (M-mode): 2.0 cm LEFT ATRIUM             Index LA diam:        2.70 cm 1.54 cm/m LA Vol (A2C):   49.9 ml 28.37 ml/m LA Vol (A4C):   30.8 ml 17.51 ml/m LA Biplane Vol: 39.0 ml 22.18 ml/m  AORTIC VALVE AV Area (Vmax):    1.48 cm AV Area (Vmean):   1.70 cm AV Area (VTI):     1.63 cm AV Vmax:           162.00 cm/s AV Vmean:          101.000 cm/s AV VTI:            0.296 m AV Peak Grad:      10.5 mmHg AV Mean Grad:      5.0 mmHg LVOT Vmax:         76.10 cm/s LVOT Vmean:        54.800 cm/s LVOT VTI:          0.154 m LVOT/AV VTI ratio: 0.52 AI PHT:            600 msec  AORTA Ao Root diam: 2.40 cm MITRAL VALVE               TRICUSPID VALVE MV Area (PHT): 3.01 cm    TR Peak grad:   31.8 mmHg MV E velocity: 87.55 cm/s  TR Vmax:        282.00 cm/s MV A velocity: 55.80 cm/s MV E/A ratio:  1.57        SHUNTS                             Systemic VTI:  0.15 m                            Systemic Diam: 2.00 cm Kristeen Miss MD Electronically signed by Kristeen Miss MD Signature Date/Time: 07/19/2020/2:05:19 PM    Final  Medications:   . dexmedetomidine (PRECEDEX) IV infusion 0.9 mcg/kg/hr (07/20/20 0809)  . dextrose 30 mL/hr at 07/20/20 0809  . DOPamine 5 mcg/kg/min (07/20/20 0809)  . doxycycline (VIBRAMYCIN) IV Stopped (07/20/20 16100649)  . norepinephrine (LEVOPHED) Adult infusion Stopped (07/19/20 0557)  . vasopressin Stopped (07/19/20 1944)   . Chlorhexidine Gluconate Cloth  6 each Topical Q0600   heparin sodium (porcine)  Assessment/ Plan:  74 y.o. 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  Acute kidney injury on CKD st 2. Baseline creatinine 1.0/GFR 66 from January 17, 2020 Recent Labs    03-24-20 2319 07/19/20 0426 07/19/20 1405 07/20/20 0416  CREATININE 3.49* 3.74* 3.75* 3.17*  Urinalysis December 25 shows protein 100 mg/dL, 96-0421-50 RBCs, 54-0911-20 WBCs We will obtain a urine protein to creatinine ratio We will obtain renal ultrasound Covid panel is negative AKI likely secondary to ATN due to hemodynamic instability from bradycardia  UOP is good S Creatinine trends are improving -Electrolytes and volume status are acceptable.  No acute indication for dialysis.  Temporary dialysis catheter is available.   - We will continue to monitor on a daily basis  #. Anemia   Lab Results  Component Value Date   HGB 8.7 (L) 07/20/2020   Monitor closely   #.  Bradycardia and hypotension Required Temporary pacemaker  Antihypertensives are on hold pressors are weaned off  cortisol elevated  # Acute resp failure Requiring biPAP Cause: volume overload vs pneumonia    LOS: 2 Julie Roberts Julie Roberts 12/26/20219:43 AM  [facility]  [facility] [place], [place] [phone]

## 2020-07-20 NOTE — Plan of Care (Signed)
  Problem: Clinical Measurements: Goal: Ability to maintain clinical measurements within normal limits will improve Outcome: Progressing Goal: Will remain free from infection Outcome: Progressing Goal: Diagnostic test results will improve Outcome: Progressing Goal: Respiratory complications will improve Outcome: Progressing Goal: Cardiovascular complication will be avoided Outcome: Progressing   Problem: Nutrition: Goal: Adequate nutrition will be maintained Outcome: Progressing   Problem: Coping: Goal: Level of anxiety will decrease Outcome: Progressing   Problem: Elimination: Goal: Will not experience complications related to bowel motility Outcome: Progressing   Problem: Pain Managment: Goal: General experience of comfort will improve Outcome: Progressing   Problem: Safety: Goal: Ability to remain free from injury will improve Outcome: Progressing

## 2020-07-20 NOTE — Progress Notes (Signed)
CXR shows minor improvement of opacities, ABG: 7.39/ 29/ 61/ 17.6  Concern that patient is attempting to compensate for metabolic acidosis. - 2 amps of sodium bicarbonate given, with significant improvement in HR- 80's/ SpO2 readable at 95% & hemodynamically stable - UOP has continued to improve - sodium bicarbonate infusion ordered for limited timeframe with repeat ABG at 01:00   Cheryll Cockayne Rust-Chester, AGACNP-BC Acute Care Nurse Practitioner [facility]   [phone] / [phone] Please see Amion for pager details.

## 2020-07-20 NOTE — Progress Notes (Signed)
PHARMACY CONSULT NOTE  Pharmacy Consult for Electrolyte Monitoring and Replacement   Recent Labs: Potassium (mmol/L)  Date Value  07/20/2020 5.0   Magnesium (mg/dL)  Date Value  67/20/9470 1.8   Calcium (mg/dL)  Date Value  96/28/3662 8.8 (L)   Albumin (g/dL)  Date Value  94/76/5465 2.4 (L)   Phosphorus (mg/dL)  Date Value  03/54/6568 4.3   Sodium (mmol/L)  Date Value  07/20/2020 141   Corrected Ca: 10.1 mg/dL  Assessment:  74 y.o. female with a hx of essential hypertension, hyperlipidemia, chronic kidney disease, COPD and recent memory problems. Current problems include AKI, hypotension, bradycardia since resolved  Goal of Therapy:  Electrolytes WNL  Plan:   2 grams IV magnesium sulfate x 1  Re-check electrolytes in am  Lowella Bandy ,PharmD Clinical Pharmacist 07/20/2020 7:20 AM

## 2020-07-20 NOTE — Consult Note (Addendum)
Pharmacy Antibiotic Note  Julie Roberts is a 74 y.o. female admitted on 07/23/2020 with bradycardia and AMS. Patient furthermore with AKI which appears to be improving. Urine culture now with Enterobacter aerogenes. PCT < 0.10 on admission up to 4.98 today. Pharmacy has been consulted for meropenem dosing.  Plan: Meropenem 500 mg IV q12h (renally adjusted)  Height: 5\' 3"  (160 cm) Weight: 76.3 kg (168 lb 3.4 oz) IBW/kg (Calculated) : 52.4  Temp (24hrs), Avg:97.1 F (36.2 C), Min:95 F (35 C), Max:99.14 F (37.3 C)  Recent Labs  Lab 06/25/2020 1600 07/21/2020 2319 07/19/20 0426 07/19/20 1405 07/20/20 0416  WBC 11.4*  --  7.3  --  8.7  CREATININE 3.31* 3.49* 3.74* 3.75* 3.17*  LATICACIDVEN 3.3* 2.5*  --   --   --     Estimated Creatinine Clearance: 15.2 mL/min (A) (by C-G formula based on SCr of 3.17 mg/dL (H)).    Allergies  Allergen Reactions  . Penicillins Hives, Palpitations and Swelling    Pt states has swelling, hives, and palpitations when has any penicillins.   Pt states has swelling, hives, and palpitations when has any penicillins.   Pt states has swelling, hives, and palpitations when has any penicillins.     . Ace Inhibitors Other (See Comments)    Cough Cough     Antimicrobials this admission: Doxycycline 12/25 >> 12/26 Meropenem 12/26 >>   Dose adjustments this admission: N/A  Microbiology results: 12/24 MRSA PCR: negative 12/24 BCx (single-set): NGTD 12/25 UCx: Enterobacter aerogenes, susceptibilities pending   Thank you for allowing pharmacy to be a part of this patient's care.  1/26 07/20/2020 9:08 PM

## 2020-07-21 ENCOUNTER — Inpatient Hospital Stay: Payer: Medicare Other

## 2020-07-21 ENCOUNTER — Encounter: Payer: Self-pay | Admitting: Cardiovascular Disease

## 2020-07-21 DIAGNOSIS — R001 Bradycardia, unspecified: Secondary | ICD-10-CM | POA: Diagnosis not present

## 2020-07-21 LAB — BLOOD GAS, VENOUS
Acid-Base Excess: 4 mmol/L — ABNORMAL HIGH (ref 0.0–2.0)
Acid-Base Excess: 2.5 mmol/L — ABNORMAL HIGH (ref 0.0–2.0)
Bicarbonate: 27.3 mmol/L (ref 20.0–28.0)
Bicarbonate: 26.1 mmol/L (ref 20.0–28.0)
Delivery systems: POSITIVE
FIO2: 0.6
O2 Saturation: 67 %
O2 Saturation: 63.5 %
PEEP: 6 cmH2O
PIP: 12 cmH2O
Patient temperature: 37
Patient temperature: 37
RATE: 10 resp/min
pCO2, Ven: 35 mmHg — ABNORMAL LOW (ref 44.0–60.0)
pCO2, Ven: 35 mmHg — ABNORMAL LOW (ref 44.0–60.0)
pH, Ven: 7.5 — ABNORMAL HIGH (ref 7.250–7.430)
pH, Ven: 7.48 — ABNORMAL HIGH (ref 7.250–7.430)

## 2020-07-21 LAB — GLUCOSE, CAPILLARY
Glucose-Capillary: 92 mg/dL (ref 70–99)
Glucose-Capillary: 82 mg/dL (ref 70–99)
Glucose-Capillary: 93 mg/dL (ref 70–99)
Glucose-Capillary: 94 mg/dL (ref 70–99)
Glucose-Capillary: 83 mg/dL (ref 70–99)
Glucose-Capillary: 110 mg/dL — ABNORMAL HIGH (ref 70–99)

## 2020-07-21 LAB — URINE CULTURE: Culture: 60000 — AB

## 2020-07-21 LAB — RENAL FUNCTION PANEL
Albumin: 2.2 g/dL — ABNORMAL LOW (ref 3.5–5.0)
Anion gap: 8 (ref 5–15)
BUN: 46 mg/dL — ABNORMAL HIGH (ref 8–23)
CO2: 25 mmol/L (ref 22–32)
Calcium: 8.9 mg/dL (ref 8.9–10.3)
Chloride: 108 mmol/L (ref 98–111)
Creatinine, Ser: 2.72 mg/dL — ABNORMAL HIGH (ref 0.44–1.00)
GFR, Estimated: 18 mL/min — ABNORMAL LOW (ref >60)
Glucose, Bld: 87 mg/dL (ref 70–99)
Phosphorus: 3.7 mg/dL (ref 2.5–4.6)
Potassium: 4.7 mmol/L (ref 3.5–5.1)
Sodium: 141 mmol/L (ref 135–145)

## 2020-07-21 LAB — CBC
HCT: 22.9 % — ABNORMAL LOW (ref 36.0–46.0)
Hemoglobin: 7.8 g/dL — ABNORMAL LOW (ref 12.0–15.0)
MCH: 31.3 pg (ref 26.0–34.0)
MCHC: 34.1 g/dL (ref 30.0–36.0)
MCV: 92 fL (ref 80.0–100.0)
Platelets: 80 10*3/uL — ABNORMAL LOW (ref 150–400)
RBC: 2.49 MIL/uL — ABNORMAL LOW (ref 3.87–5.11)
RDW: 15.9 % — ABNORMAL HIGH (ref 11.5–15.5)
WBC: 10 10*3/uL (ref 4.0–10.5)
nRBC: 5 % — ABNORMAL HIGH (ref 0.0–0.2)

## 2020-07-21 LAB — PATHOLOGIST SMEAR REVIEW

## 2020-07-21 LAB — MAGNESIUM: Magnesium: 2.3 mg/dL (ref 1.7–2.4)

## 2020-07-21 MED ORDER — ONDANSETRON HCL 4 MG/2ML IJ SOLN: 4.0000 mg | Freq: Four times a day (QID) | INTRAMUSCULAR | Status: DC | PRN

## 2020-07-21 MED ORDER — CIPROFLOXACIN IN D5W 400 MG/200ML IV SOLN
400.0000 mg | INTRAVENOUS | Status: DC
  Administered 2020-07-21 – 2020-07-22 (×2): 400 mg via INTRAVENOUS
  Filled 2020-07-21 (×3): qty 200

## 2020-07-21 MED ORDER — FENTANYL CITRATE (PF) 100 MCG/2ML IJ SOLN
INTRAMUSCULAR | Status: AC
  Administered 2020-07-21: 02:00:00 25 ug via INTRAVENOUS
  Filled 2020-07-21: qty 2

## 2020-07-21 MED ORDER — FENTANYL CITRATE (PF) 100 MCG/2ML IJ SOLN: 25.0000 ug | Freq: Once | INTRAMUSCULAR | Status: AC

## 2020-07-21 MED ORDER — ONDANSETRON HCL 4 MG/2ML IJ SOLN
INTRAMUSCULAR | Status: AC
  Filled 2020-07-21: qty 2

## 2020-07-21 MED ORDER — MORPHINE SULFATE (PF) 2 MG/ML IV SOLN
0.5000 mg | INTRAVENOUS | Status: DC | PRN
  Administered 2020-07-21 – 2020-07-22 (×2): 1 mg via INTRAVENOUS
  Filled 2020-07-21 (×2): qty 1

## 2020-07-21 NOTE — Progress Notes (Signed)
[facility], [place] 07/21/20  Subjective:   LOS: 3 12/26 0701 - 12/27 0700 In: 1424.2 [I.V.:1171.4; IV Piggyback:252.7] Out: 2575 [Urine:2575]  Patient remains critically ill. Currently on BiPAP. Appears tachypneic at times.  Objective:  Vital signs in last 24 hours:  Temp:  [94.82 F (34.9 C)-100.4 F (38 C)] 98.6 F (37 C) (12/27 1200) Pulse Rate:  [62-102] 91 (12/27 1200) Resp:  [13-37] 25 (12/27 1200) BP: (89-120)/(41-93) 99/41 (12/27 1200) SpO2:  [89 %-100 %] 91 % (12/27 1200) FiO2 (%):  [30 %-60 %] 30 % (12/27 0800)  Weight change:  Filed Weights   07/03/2020 1620 07/20/20 0500  Weight: 72.6 kg 76.3 kg    Intake/Output:    Intake/Output Summary (Last 24 hours) at 07/21/2020 1241 Last data filed at 07/21/2020 1229 Gross per 24 hour  Intake 1216.79 ml  Output 1975 ml  Net -758.21 ml     Physical Exam: General: critically ill appearing  HEENT BiPAP facemask on  Pulm/lungs bilateral rales  CVS/Heart regular  Abdomen:  soft  Extremities: Trace edema  Neurologic: sedated  Skin: No acute rashes  Access: Rt IJ temp cath  GU: Foley in place    Basic Metabolic Panel:  Recent Labs  Lab 07/22/2020 2319 07/19/20 0426 07/19/20 1405 07/19/20 1527 07/19/20 2021 07/20/20 0416 07/21/20 0609  NA 133* 132* 135  --   --  141 141  K 5.5* 4.4 6.3* 5.9* 5.7* 5.0 4.7  CL 103 102 103  --   --  106 108  CO2 20* 19* 19*  --   --  26 25  GLUCOSE 121* 118* 119*  --   --  98 87  BUN 55* 57* 58*  --   --  54* 46*  CREATININE 3.49* 3.74* 3.75*  --   --  3.17* 2.72*  CALCIUM 9.7 8.9 9.2  --   --  8.8* 8.9  MG  --  1.9  --   --   --  1.8 2.3  PHOS  --  1.8* 4.8*  --   --  4.3 3.7     CBC: Recent Labs  Lab 07/06/2020 1600 07/19/20 0426 07/20/20 0416 07/21/20 0609  WBC 11.4* 7.3 8.7 10.0  NEUTROABS 8.5* 6.5  --   --   HGB 8.6* 8.8* 8.7* 7.8*  HCT 25.3* 26.6* 24.2* 22.9*  MCV 92.3 93.7 88.0 92.0  PLT 142* 152 101* 80*     No  results found for: HEPBSAG, HEPBSAB, HEPBIGM    Microbiology:  Recent Results (from the past 240 hour(s))  Blood culture (routine single)     Status: None (Preliminary result)   Collection Time: 07/22/2020  3:37 PM   Specimen: BLOOD  Result Value Ref Range Status   Specimen Description BLOOD BLOOD LEFT HAND  Final   Special Requests   Final    BOTTLES DRAWN AEROBIC AND ANAEROBIC Blood Culture results may not be optimal due to an inadequate volume of blood received in culture bottles   Culture   Final    NO GROWTH 3 DAYS Performed at [facility], [address] [place], [place] [address]    Report Status PENDING  Incomplete  Resp Panel by RT-PCR (Flu A&B, Covid) Nasopharyngeal Swab     Status: None   Collection Time: 07/20/2020  4:00 PM   Specimen: Nasopharyngeal Swab; Nasopharyngeal(NP) swabs in vial transport medium  Result Value Ref Range Status   SARS Coronavirus 2 by RT PCR NEGATIVE NEGATIVE  Final    Comment: (NOTE) SARS-CoV-2 target nucleic acids are NOT DETECTED.  The SARS-CoV-2 RNA is generally detectable in upper respiratory specimens during the acute phase of infection. The lowest concentration of SARS-CoV-2 viral copies this assay can detect is 138 copies/mL. A negative result does not preclude SARS-Cov-2 infection and should not be used as the sole basis for treatment or other patient management decisions. A negative result may occur with  improper specimen collection/handling, submission of specimen other than nasopharyngeal swab, presence of viral mutation(s) within the areas targeted by this assay, and inadequate number of viral copies(<138 copies/mL). A negative result must be combined with clinical observations, patient history, and epidemiological information. The expected result is Negative.  Fact Sheet for Patients:  BloggerCourse.com  Fact Sheet for Healthcare Providers:  SeriousBroker.it  This  test is no t yet approved or cleared by the [place] FDA and  has been authorized for detection and/or diagnosis of SARS-CoV-2 by FDA under an Emergency Use Authorization (EUA). This EUA will remain  in effect (meaning this test can be used) for the duration of the COVID-19 declaration under Section 564(b)(1) of the Act, 21 U.S.C.section 360bbb-3(b)(1), unless the authorization is terminated  or revoked sooner.       Influenza A by PCR NEGATIVE NEGATIVE Final   Influenza B by PCR NEGATIVE NEGATIVE Final    Comment: (NOTE) The Xpert Xpress SARS-CoV-2/FLU/RSV plus assay is intended as an aid in the diagnosis of influenza from Nasopharyngeal swab specimens and should not be used as a sole basis for treatment. Nasal washings and aspirates are unacceptable for Xpert Xpress SARS-CoV-2/FLU/RSV testing.  Fact Sheet for Patients: BloggerCourse.com  Fact Sheet for Healthcare Providers: SeriousBroker.it  This test is not yet approved or cleared by the [place] FDA and has been authorized for detection and/or diagnosis of SARS-CoV-2 by FDA under an Emergency Use Authorization (EUA). This EUA will remain in effect (meaning this test can be used) for the duration of the COVID-19 declaration under Section 564(b)(1) of the Act, 21 U.S.C. section 360bbb-3(b)(1), unless the authorization is terminated or revoked.  Performed at [facility], [address] [place], [place] [address]   MRSA PCR Screening     Status: None   Collection Time: 2020-08-02  5:47 PM   Specimen: Nasopharyngeal  Result Value Ref Range Status   MRSA by PCR NEGATIVE NEGATIVE Final    Comment:        The GeneXpert MRSA Assay (FDA approved for NASAL specimens only), is one component of a comprehensive MRSA colonization surveillance program. It is not intended to diagnose MRSA infection nor to guide or monitor treatment for MRSA  infections. Performed at [facility], [address] [place], [place] [address]   Urine culture     Status: Abnormal   Collection Time: 07/19/20  4:10 AM   Specimen: In/Out Cath Urine  Result Value Ref Range Status   Specimen Description   Final    IN/OUT CATH URINE Performed at [facility], [address], [place], [place] [address]    Special Requests   Final    NONE Performed at [facility], [address] [place], [place] [address]    Culture 60,000 COLONIES/mL ENTEROBACTER AEROGENES (A)  Final   Report Status 07/21/2020 FINAL  Final   Organism ID, Bacteria ENTEROBACTER AEROGENES (A)  Final      Susceptibility   Enterobacter aerogenes - MIC*    CEFAZOLIN RESISTANT Resistant  CEFEPIME <=0.12 SENSITIVE Sensitive     CEFTRIAXONE <=0.25 SENSITIVE Sensitive     CIPROFLOXACIN <=0.25 SENSITIVE Sensitive     GENTAMICIN <=1 SENSITIVE Sensitive     IMIPENEM <=0.25 SENSITIVE Sensitive     NITROFURANTOIN 32 SENSITIVE Sensitive     TRIMETH/SULFA <=20 SENSITIVE Sensitive     PIP/TAZO <=4 SENSITIVE Sensitive     * 60,000 COLONIES/mL ENTEROBACTER AEROGENES    Coagulation Studies: Recent Labs    07/11/2020 1600  LABPROT 12.6  INR 1.0    Urinalysis: Recent Labs    07/19/20 0410  COLORURINE AMBER*  LABSPEC 1.020  PHURINE 5.0  GLUCOSEU NEGATIVE  HGBUR NEGATIVE  BILIRUBINUR NEGATIVE  KETONESUR NEGATIVE  PROTEINUR 100*  NITRITE NEGATIVE  LEUKOCYTESUR NEGATIVE      Imaging: US RENAL  Result Date: 07/20/2020 CLINICAL DATA:  Acute renal failure EXAM: RENAL / URINARY TRACT ULTRASOUND COMPLETE COMPARISON:  None. FINDINGS: Right Kidney: Renal measurements: 9.3 x 4.1 x 4.3 cm = volume: 87 mL. Increased echogenicity. No hydronephrosis. Left Kidney: Renal measurements: 7.6 x 5.4 x 5.1 cm = volume: 110 mL. Increased echogenicity. No hydronephrosis. Bladder: Foley catheter within.  Bladder not entirely decompressed. Other: None. IMPRESSION: 1.  No  hydronephrosis. 2. Increased renal echogenicity, suggesting medical renal disease. 3. Bladder not entirely decompressed by Foley catheter. Correlate with catheter function. Electronically Signed   By: Jeronimo Greaves M.D.   On: 07/20/2020 12:18   DG Chest Port 1 View  Result Date: 07/21/2020 CLINICAL DATA:  Altered mental status. Acute kidney injury. Bradycardia. EXAM: PORTABLE CHEST 1 VIEW COMPARISON:  07/19/2020 FINDINGS: Right jugular central venous catheter remains in appropriate position. Asymmetric airspace disease is again seen involving right lung greater than left, without significant change. No evidence of pleural effusion or pneumothorax. Heart size remains normal. IMPRESSION: No significant change in asymmetric airspace disease, right lung greater than left. Electronically Signed   By: Danae Orleans M.D.   On: 07/21/2020 08:25   DG Chest Port 1 View  Result Date: 07/19/2020 CLINICAL DATA:  74 year old female with shortness of breath EXAM: PORTABLE CHEST 1 VIEW COMPARISON:  Chest radiograph dated 07/19/2020. FINDINGS: Interval removal of the pacer wire. Right IJ dialysis catheter with tip at the cavoatrial junction. Bilateral pulmonary opacities, right greater left, improved since the prior radiograph. No pleural effusion or pneumothorax. Stable cardiac silhouette. Atherosclerotic calcification of the aorta. No acute osseous pathology. IMPRESSION: Slight interval improvement of bilateral airspace opacities. Electronically Signed   By: Elgie Collard M.D.   On: 07/19/2020 20:25     Medications:   . sodium chloride 10 mL/hr at 07/21/20 1229  . dexmedetomidine (PRECEDEX) IV infusion 0.4 mcg/kg/hr (07/21/20 1229)  . dextrose 30 mL/hr at 07/21/20 1229  . meropenem (MERREM) IV Stopped (07/21/20 1019)  . norepinephrine (LEVOPHED) Adult infusion Stopped (07/19/20 0557)   . Chlorhexidine Gluconate Cloth  6 each Topical Q0600   sodium chloride, heparin sodium (porcine), ondansetron (ZOFRAN)  IV  Assessment/ Plan:  74 y.o. 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  Acute kidney injury on CKD st 2. Baseline creatinine 1.0/GFR 66 from January 17, 2020 Recent Labs    07/19/20 0426 07/19/20 1405 07/20/20 0416 07/21/20 0609  CREATININE 3.74* 3.75* 3.17* 2.72*  Urinalysis December 25 shows protein 100 mg/dL, 22-29 RBCs, 79-89 WBCs Covid panel is negative  AKI likely secondary to ATN due  to hemodynamic instability from bradycardia -Creatinine currently down to 2.72.  Good urine output noted.  No immediate need for dialysis at the moment.  #. Anemia of CKD.  Lab Results  Component Value Date   HGB 7.8 (L) 07/21/2020   Hemoglobin currently 7.8.  No immediate need for transfusion.   #.  Bradycardia and hypotension Required Temporary pacemaker  Antihypertensives are on hold pressors are weaned off cortisol elevated  # Acute resp failure Remains on BiPAP at the moment.    LOS: 3 Julie Roberts 12/27/202112:41 PM  [facility] [place], [place] [phone]

## 2020-07-21 NOTE — Progress Notes (Signed)
CRITICAL CARE PROGRESS NOTE    Name: Julie Roberts MRN: [id number] DOB: 17-Feb-1946     LOS: 3   SUBJECTIVE FINDINGS & SIGNIFICANT EVENTS    Patient description:   74 yo F PMH as below, daughter gives history as patient is with encephalopathy.  Daughter states despite significant comorbid history she was able to drive 1 month ago and does all ADLs on her own.  She has been unsteady on feet with disequilibrium over past 2 months and she has had MRI brian for possible CVA on outpatient.  She was brought to ED due to worsening dizziness and altered mentation and found to have profound bradycardia in the teens.She had transcutaneous pacing placed. She was emergently seen by cardiology who was able to place transvenous TPM.  She was also noted to have A/CKD and hypothermia with admission to MICU for additional evaluation and management.    07/19/20- patient is improved.  I spoke with Dr Acie Fredrickson regarding cardiology plan-TPM removed from [facility]. Daughter at bedside reviewed care plan. Neurology consult for confusion.   07/20/20- patient on BIPAP.  Plan to wean precedex today and remove NIV.  Met with daughter of patient [name] and [name].    07/21/20-Patient able to answer simple question and follow commands   Lines/tubes :   Microbiology/Sepsis markers: Results for orders placed or performed during the hospital encounter of 07/14/2020  Blood culture (routine single)     Status: None (Preliminary result)   Collection Time: 07/19/2020  3:37 PM   Specimen: BLOOD  Result Value Ref Range Status   Specimen Description BLOOD BLOOD LEFT HAND  Final   Special Requests   Final    BOTTLES DRAWN AEROBIC AND ANAEROBIC Blood Culture results may not be optimal due to an inadequate volume of blood received in culture bottles   Culture    Final    NO GROWTH 3 DAYS Performed at [facility], [address] [place], [place] [address]    Report Status PENDING  Incomplete  Resp Panel by RT-PCR (Flu A&B, Covid) Nasopharyngeal Swab     Status: None   Collection Time: 07/23/2020  4:00 PM   Specimen: Nasopharyngeal Swab; Nasopharyngeal(NP) swabs in vial transport medium  Result Value Ref Range Status   SARS Coronavirus 2 by RT PCR NEGATIVE NEGATIVE Final    Comment: (NOTE) SARS-CoV-2 target nucleic acids are NOT DETECTED.  The SARS-CoV-2 RNA is generally detectable in upper respiratory specimens during the acute phase of infection. The lowest concentration of SARS-CoV-2 viral copies this assay can detect is 138 copies/mL. A negative result does not preclude SARS-Cov-2 infection and should not be used as the sole basis for treatment or other patient management decisions. A negative result may occur with  improper specimen collection/handling, submission of specimen other than nasopharyngeal swab, presence of viral mutation(s) within the areas targeted by this assay, and inadequate number of viral copies(<138 copies/mL). A negative result must be combined with clinical observations, patient history, and epidemiological information. The expected result is Negative.  Fact Sheet for Patients:  EntrepreneurPulse.com.au  Fact Sheet for Healthcare Providers:  IncredibleEmployment.be  This test is no t yet approved or cleared by the [place] FDA and  has been authorized for detection and/or diagnosis of SARS-CoV-2 by FDA under an Emergency Use Authorization (EUA). This EUA will remain  in effect (meaning this test can be used) for the duration of the COVID-19 declaration under Section 564(b)(1) of the Act, 21 U.S.C.section 360bbb-3(b)(1), unless the authorization  is terminated  or revoked sooner.       Influenza A by PCR NEGATIVE NEGATIVE Final   Influenza B by PCR NEGATIVE  NEGATIVE Final    Comment: (NOTE) The Xpert Xpress SARS-CoV-2/FLU/RSV plus assay is intended as an aid in the diagnosis of influenza from Nasopharyngeal swab specimens and should not be used as a sole basis for treatment. Nasal washings and aspirates are unacceptable for Xpert Xpress SARS-CoV-2/FLU/RSV testing.  Fact Sheet for Patients: EntrepreneurPulse.com.au  Fact Sheet for Healthcare Providers: IncredibleEmployment.be  This test is not yet approved or cleared by the [place] FDA and has been authorized for detection and/or diagnosis of SARS-CoV-2 by FDA under an Emergency Use Authorization (EUA). This EUA will remain in effect (meaning this test can be used) for the duration of the COVID-19 declaration under Section 564(b)(1) of the Act, 21 U.S.C. section 360bbb-3(b)(1), unless the authorization is terminated or revoked.  Performed at [facility], [address] [place], [place] [address]   MRSA PCR Screening     Status: None   Collection Time: 07/01/2020  5:47 PM   Specimen: Nasopharyngeal  Result Value Ref Range Status   MRSA by PCR NEGATIVE NEGATIVE Final    Comment:        The GeneXpert MRSA Assay (FDA approved for NASAL specimens only), is one component of a comprehensive MRSA colonization surveillance program. It is not intended to diagnose MRSA infection nor to guide or monitor treatment for MRSA infections. Performed at [facility], [address], [place], [place] [address]   Urine culture     Status: Abnormal   Collection Time: 07/19/20  4:10 AM   Specimen: In/Out Cath Urine  Result Value Ref Range Status   Specimen Description   Final    IN/OUT CATH URINE Performed at [facility], [address] [place], [place] [address]    Special Requests   Final    NONE Performed at [facility], [address] [place], [place] [address]    Culture 60,000 COLONIES/mL  ENTEROBACTER AEROGENES (A)  Final   Report Status 07/21/2020 FINAL  Final   Organism ID, Bacteria ENTEROBACTER AEROGENES (A)  Final      Susceptibility   Enterobacter aerogenes - MIC*    CEFAZOLIN RESISTANT Resistant     CEFEPIME <=0.12 SENSITIVE Sensitive     CEFTRIAXONE <=0.25 SENSITIVE Sensitive     CIPROFLOXACIN <=0.25 SENSITIVE Sensitive     GENTAMICIN <=1 SENSITIVE Sensitive     IMIPENEM <=0.25 SENSITIVE Sensitive     NITROFURANTOIN 32 SENSITIVE Sensitive     TRIMETH/SULFA <=20 SENSITIVE Sensitive     PIP/TAZO <=4 SENSITIVE Sensitive     * 60,000 COLONIES/mL ENTEROBACTER AEROGENES    Anti-infectives:  Anti-infectives (From admission, onward)   Start     Dose/Rate Route Frequency Ordered Stop   07/21/20 1800  ciprofloxacin (CIPRO) IVPB 400 mg        400 mg 200 mL/hr over 60 Minutes Intravenous Every 24 hours 07/21/20 1500     07/20/20 2215  meropenem (MERREM) 500 mg in sodium chloride 0.9 % 100 mL IVPB  Status:  Discontinued        500 mg 200 mL/hr over 30 Minutes Intravenous Every 12 hours 07/20/20 2120 07/21/20 1457   07/20/20 2200  ceFEPIme (MAXIPIME) 2 g in sodium chloride 0.9 % 100 mL IVPB  Status:  Discontinued        2 g 200 mL/hr over 30 Minutes Intravenous Every  24 hours 07/20/20 2105 07/20/20 2118   07/19/20 0500  doxycycline (VIBRAMYCIN) 100 mg in sodium chloride 0.9 % 250 mL IVPB  Status:  Discontinued        100 mg 125 mL/hr over 120 Minutes Intravenous Every 12 hours 07/19/20 0357 07/20/20 1112       Consults: Treatment Team:  Murlean Iba, MD Leotis Pain, MD      PAST MEDICAL HISTORY   Past Medical History:  Diagnosis Date  . Asthma   . Hypertension      SURGICAL HISTORY   Past Surgical History:  Procedure Laterality Date  . BREAST BIOPSY Left 2013   neg  . TEMPORARY PACEMAKER N/A 07/19/2020   Procedure: TEMPORARY PACEMAKER;  Surgeon: Wellington Hampshire, MD;  Location: [facility];  Service: Cardiovascular;  Laterality:  N/A;     FAMILY HISTORY   Family History  Problem Relation Age of Onset  . Breast cancer Neg Hx      SOCIAL HISTORY       MEDICATIONS   Current Medication:  Current Facility-Administered Medications:  .  0.9 %  sodium chloride infusion, , Intravenous, PRN, Rust-Chester, Britton L, NP, Last Rate: 10 mL/hr at 07/21/20 1542, Infusion Verify at 07/21/20 1542 .  Chlorhexidine Gluconate Cloth 2 % PADS 6 each, 6 each, Topical, Q0600, Ottie Glazier, MD, 6 each at 07/20/20 0430 .  ciprofloxacin (CIPRO) IVPB 400 mg, 400 mg, Intravenous, Q24H, Tyna Jaksch, MD .  dexmedetomidine (PRECEDEX) 400 MCG/100ML (4 mcg/mL) infusion, 0.4-1.2 mcg/kg/hr, Intravenous, Titrated, Rust-Chester, Britton L, NP, Last Rate: 7.63 mL/hr at 07/21/20 1542, 0.4 mcg/kg/hr at 07/21/20 1542 .  dextrose 10 % infusion, , Intravenous, Continuous, Ottie Glazier, MD, Last Rate: 30 mL/hr at 07/21/20 1542, Infusion Verify at 07/21/20 1542 .  heparin sodium (porcine) injection 1,000-6,000 Units, 1,000-6,000 Units, Intravenous, PRN, Awilda Bill, NP .  ondansetron (ZOFRAN) injection 4 mg, 4 mg, Intravenous, Q6H PRN, Rust-Chester, Huel Cote, NP    ALLERGIES   Penicillins and Ace inhibitors    REVIEW OF SYSTEMS    Unable to obtain due to encephalopathy  PHYSICAL EXAMINATION   Vital Signs: Temp:  [94.82 F (34.9 C)-100.4 F (38 C)] 96.08 F (35.6 C) (12/27 1600) Pulse Rate:  [62-102] 69 (12/27 1600) Resp:  [12-37] 12 (12/27 1600) BP: (89-122)/(41-109) 98/43 (12/27 1600) SpO2:  [89 %-100 %] 91 % (12/27 1600) FiO2 (%):  [30 %-60 %] 30 % (12/27 0800)  GENERAL:age appropriate HEAD: Normocephalic, atraumatic.  EYES: Pupils equal, round, reactive to light.  No scleral icterus.  MOUTH: Moist mucosal membrane. NECK: Supple. No thyromegaly. No nodules. No JVD.  PULMONARY: decreased air entry bilaterally  CARDIOVASCULAR: S1 and S2. Regular rate and rhythm. No murmurs, rubs, or gallops.   GASTROINTESTINAL: Soft, nontender, non-distended. No masses. Positive bowel sounds. No hepatosplenomegaly.  MUSCULOSKELETAL: No swelling, clubbing, or edema.  NEUROLOGIC: GCS10 SKIN:intact,warm,dry   PERTINENT DATA     Infusions: . sodium chloride 10 mL/hr at 07/21/20 1542  . ciprofloxacin    . dexmedetomidine (PRECEDEX) IV infusion 0.4 mcg/kg/hr (07/21/20 1542)  . dextrose 30 mL/hr at 07/21/20 1542   Scheduled Medications: . Chlorhexidine Gluconate Cloth  6 each Topical Q0600   PRN Medications:  Hemodynamic parameters:   Intake/Output: 12/26 0701 - 12/27 0700 In: 1424.2 [I.V.:1171.4; IV Piggyback:252.7] Out: 2575 [Urine:2575]  Ventilator  Settings: FiO2 (%):  [30 %-60 %] 30 %   LAB RESULTS:  Basic Metabolic Panel: Recent Labs  Lab 07/06/2020 2319 07/19/20 0426  07/19/20 1405 07/19/20 1527 07/20/20 0416 07/21/20 0609  NA 133* 132* 135  --  141 141  K 5.5* 4.4 6.3*   < > 5.0 4.7  CL 103 102 103  --  106 108  CO2 20* 19* 19*  --  26 25  GLUCOSE 121* 118* 119*  --  98 87  BUN 55* 57* 58*  --  54* 46*  CREATININE 3.49* 3.74* 3.75*  --  3.17* 2.72*  CALCIUM 9.7 8.9 9.2  --  8.8* 8.9  MG  --  1.9  --   --  1.8 2.3  PHOS  --  1.8* 4.8*  --  4.3 3.7   < > = values in this interval not displayed.   Liver Function Tests: Recent Labs  Lab 07/04/2020 1600 07/19/20 0426 07/20/20 0416 07/21/20 0609  AST 61*  --   --   --   ALT 56*  --   --   --   ALKPHOS 152*  --   --   --   BILITOT 0.5  --   --   --   PROT 6.2*  --   --   --   ALBUMIN 3.1* 2.4* 2.4* 2.2*   No results for input(s): LIPASE, AMYLASE in the last 168 hours. Recent Labs  Lab 07/19/20 2025  AMMONIA 16   CBC: Recent Labs  Lab 07/17/2020 1600 07/19/20 0426 07/20/20 0416 07/21/20 0609  WBC 11.4* 7.3 8.7 10.0  NEUTROABS 8.5* 6.5  --   --   HGB 8.6* 8.8* 8.7* 7.8*  HCT 25.3* 26.6* 24.2* 22.9*  MCV 92.3 93.7 88.0 92.0  PLT 142* 152 101* 80*   Cardiac Enzymes: No results for input(s): CKTOTAL,  CKMB, CKMBINDEX, TROPONINI in the last 168 hours. BNP: Invalid input(s): POCBNP CBG: Recent Labs  Lab 07/20/20 2320 07/21/20 0616 07/21/20 0750 07/21/20 1133 07/21/20 1639  GLUCAP 105* 92 82 93 94       IMAGING RESULTS:  Imaging: US RENAL  Result Date: 07/20/2020 CLINICAL DATA:  Acute renal failure EXAM: RENAL / URINARY TRACT ULTRASOUND COMPLETE COMPARISON:  None. FINDINGS: Right Kidney: Renal measurements: 9.3 x 4.1 x 4.3 cm = volume: 87 mL. Increased echogenicity. No hydronephrosis. Left Kidney: Renal measurements: 7.6 x 5.4 x 5.1 cm = volume: 110 mL. Increased echogenicity. No hydronephrosis. Bladder: Foley catheter within.  Bladder not entirely decompressed. Other: None. IMPRESSION: 1.  No hydronephrosis. 2. Increased renal echogenicity, suggesting medical renal disease. 3. Bladder not entirely decompressed by Foley catheter. Correlate with catheter function. Electronically Signed   By: Abigail Miyamoto M.D.   On: 07/20/2020 12:18   DG Chest Port 1 View  Result Date: 07/21/2020 CLINICAL DATA:  Altered mental status. Acute kidney injury. Bradycardia. EXAM: PORTABLE CHEST 1 VIEW COMPARISON:  07/19/2020 FINDINGS: Right jugular central venous catheter remains in appropriate position. Asymmetric airspace disease is again seen involving right lung greater than left, without significant change. No evidence of pleural effusion or pneumothorax. Heart size remains normal. IMPRESSION: No significant change in asymmetric airspace disease, right lung greater than left. Electronically Signed   By: Marlaine Hind M.D.   On: 07/21/2020 08:25   DG Chest Port 1 View  Result Date: 07/19/2020 CLINICAL DATA:  74 year old female with shortness of breath EXAM: PORTABLE CHEST 1 VIEW COMPARISON:  Chest radiograph dated 07/19/2020. FINDINGS: Interval removal of the pacer wire. Right IJ dialysis catheter with tip at the cavoatrial junction. Bilateral pulmonary opacities, right greater left, improved since the  prior  radiograph. No pleural effusion or pneumothorax. Stable cardiac silhouette. Atherosclerotic calcification of the aorta. No acute osseous pathology. IMPRESSION: Slight interval improvement of bilateral airspace opacities. Electronically Signed   By: Anner Crete M.D.   On: 07/19/2020 20:25   _0 @ DG Chest Port 1 View  Result Date: 07/21/2020 CLINICAL DATA:  Altered mental status. Acute kidney injury. Bradycardia. EXAM: PORTABLE CHEST 1 VIEW COMPARISON:  07/19/2020 FINDINGS: Right jugular central venous catheter remains in appropriate position. Asymmetric airspace disease is again seen involving right lung greater than left, without significant change. No evidence of pleural effusion or pneumothorax. Heart size remains normal. IMPRESSION: No significant change in asymmetric airspace disease, right lung greater than left. Electronically Signed   By: Marlaine Hind M.D.   On: 07/21/2020 08:25     ASSESSMENT AND PLAN    -Multidisciplinary rounds held today  Symptomatic bradyarrythmia   Cardiology on case - Dr Fletcher Anon - appreciate input - likely due to CKD with reaccumulation of BB -please follow cardiology recommendations -Has improved since admission.   Acute on chronic renal failure  - d/c nonessential nephrotoxins ICU monitoring -follow chem 7 -follow UO -continue Foley Catheter-assess need daily   Altered mental status with encephalopathy    -patient is at risk of inability to protect airway    - likely due to toxic metabolic encephalopathy Wake up assessment pending    -Patient with improved mentation. Is following commands. If continues to improve will consider speech eval and diet.   ID meropenem switched to cipro for enterobacter in the urine.   GI/Nutrition GI PROPHYLAXIS as indicated DIET-->TF's as tolerated Constipation protocol as indicated  ENDO - ICU hypoglycemic\Hyperglycemia protocol -check FSBS per protocol -Patient will need speech eval on  07-22-20   ELECTROLYTES -follow labs as needed -replace as needed -pharmacy consultation   DVT/GI PRX ordered -SCDs  TRANSFUSIONS AS NEEDED MONITOR FSBS ASSESS the need for LABS as needed   Critical care provider statement:    Critical care time (minutes):  35   Critical care time was exclusive of:  Separately billable procedures and treating other patients   Critical care was necessary to treat or prevent imminent or life-threatening deterioration of the following conditions:  altered mental status, encephalopathy, symptomatic bradyarrythmia, Acute on chronic renal failure, multiple comorbid conditions.   Critical care was time spent personally by me on the following activities:  Development of treatment plan with patient or surrogate, discussions with consultants, evaluation of patient's response to treatment, examination of patient, obtaining history from patient or surrogate, ordering and performing treatments and interventions, ordering and review of laboratory studies and re-evaluation of patient's condition.  I assumed direction of critical care for this patient from another provider in my specialty: no    This document was prepared using Dragon voice recognition software and may include unintentional dictation errors.   Newell Coral DO Internal Medicine/Pediatrics Pulmonary and Critical Care Fellow PGY-7

## 2020-07-21 NOTE — Progress Notes (Signed)
PHARMACY CONSULT NOTE  Pharmacy Consult for Electrolyte Monitoring and Replacement   Recent Labs: Potassium (mmol/L)  Date Value  07/21/2020 4.7   Magnesium (mg/dL)  Date Value  05/28/1593 2.3   Calcium (mg/dL)  Date Value  58/59/2924 8.9   Albumin (g/dL)  Date Value  46/28/6381 2.2 (L)   Phosphorus (mg/dL)  Date Value  77/05/6578 3.7   Sodium (mmol/L)  Date Value  07/21/2020 141    Assessment:  74 y.o. female with a hx of essential hypertension, hyperlipidemia, chronic kidney disease, COPD and recent memory problems. Current problems include AKI, hypotension, bradycardia since resolved.  MIVF: 10% dextrose @ 30 mL/hr  Goal of Therapy:  Electrolytes WNL  Plan:   No electrolyte replacement warranted today  Re-check electrolytes in am  Lowella Bandy ,PharmD Clinical Pharmacist 07/21/2020 9:47 AM

## 2020-07-21 NOTE — Progress Notes (Signed)
Initial Nutrition Assessment  DOCUMENTATION CODES:   Not applicable  INTERVENTION:  Once NGT placed and confirmed: -Initiate Osmolite 1.2 Cal at 20 mL/hr and advance by 20 mL/hr every 12 hours to goal rate of 60 mL/hr (1440 mL goal daily volume) -PROSource TF 45 mL BID per tube -Goal regimen provides 1808 kcal, 102 grams of protein, 1181 mL H2O daily  NUTRITION DIAGNOSIS:   Inadequate oral intake related to inability to eat as evidenced by NPO status.  GOAL:   Patient will meet greater than or equal to 90% of their needs  MONITOR:   Labs,Weight trends,TF tolerance,I & O's  REASON FOR ASSESSMENT:   Rounds    ASSESSMENT:   74 year old female with asthma, HTN admitted with AMS/encephalopathy, symptomatic bradyarrhythmia, acute on chronic renal failure.   Met with patient at bedside this morning but she was disoriented and unable to provide any history. Per discussion on rounds patient is unable to follow commands. No family present at time of RD assessment.  Patient currently documented to be 76.3 kg (168.21 lbs). Per review of Care Everywhere patient was 87.5 kg on 07/17/2019 and 82.8 kg on 01/31/2020. She has lost 11.2 kg (12.8% body weight) over the past year, which is not significant for time frame.  Medications reviewed and include: Cipro, Precedex gtt, D10W at 30 mL/hr.  Labs reviewed: CBG 82-93  Discussed with RN and on rounds. Plan is to attempt NGT placement today so medications can be given per tube and tube feeds.  NUTRITION - FOCUSED PHYSICAL EXAM:  Flowsheet Row Most Recent Value  Orbital Region No depletion  Upper Arm Region No depletion  Thoracic and Lumbar Region No depletion  Buccal Region No depletion  Temple Region Mild depletion  Clavicle Bone Region No depletion  Clavicle and Acromion Bone Region No depletion  Scapular Bone Region No depletion  Dorsal Hand No depletion  Patellar Region No depletion  Anterior Thigh Region Mild depletion   Posterior Calf Region Mild depletion  Edema (RD Assessment) None  Hair Reviewed  Eyes Unable to assess  Mouth Unable to assess  Skin Reviewed  Nails Reviewed     Diet Order:   Diet Order    None     EDUCATION NEEDS:   No education needs have been identified at this time  Skin:  Skin Assessment: Reviewed RN Assessment  Last BM:  07/21/2020 - small type 5  Height:   Ht Readings from Last 1 Encounters:  07/08/2020 _0  (1.6 m)   Weight:   Wt Readings from Last 1 Encounters:  07/20/20 76.3 kg   BMI:  Body mass index is 29.8 kg/m.  Estimated Nutritional Needs:   Kcal:  1700-1900  Protein:  90-100 grams  Fluid:  1.7-1.9 L/day  Jacklynn Barnacle, MS, RD, LDN Pager number available on Amion

## 2020-07-21 NOTE — Progress Notes (Signed)
Pt resting in bed quietly with eyes closed. Pt opens eyes to voice and is oriented to self. Safety mitts remain in place d/t pt attempting to pull at lines. Precedex infusing at . . We will hold off on NGT placement for today since pt is more awake. Hoping to assess for diet order tomorrow. VSS at this time. Bair hugger was turned off this morning but was turned back on this evening for temp of 95.7. Pts daughter is at bedside at this time.

## 2020-07-22 ENCOUNTER — Inpatient Hospital Stay: Payer: Medicare Other

## 2020-07-22 DIAGNOSIS — R001 Bradycardia, unspecified: Secondary | ICD-10-CM | POA: Diagnosis not present

## 2020-07-22 LAB — BLOOD GAS, ARTERIAL
Acid-base deficit: 1.4 mmol/L (ref 0.0–2.0)
Acid-base deficit: 4 mmol/L — ABNORMAL HIGH (ref 0.0–2.0)
Bicarbonate: 22.4 mmol/L (ref 20.0–28.0)
Bicarbonate: 25.2 mmol/L (ref 20.0–28.0)
Delivery systems: POSITIVE
Expiratory PAP: 8
FIO2: 1
FIO2: 1
Inspiratory PAP: 14
MECHVT: 320 mL
O2 Saturation: 89.9 %
O2 Saturation: 99.7 %
PEEP: 14 cmH2O
Patient temperature: 37
Patient temperature: 37
RATE: 18 resp/min
pCO2 arterial: 33 mmHg (ref 32.0–48.0)
pCO2 arterial: 69 mmHg (ref 32.0–48.0)
pH, Arterial: 7.44 (ref 7.350–7.450)
pH, Arterial: 7.17 — CL (ref 7.350–7.450)
pO2, Arterial: 56 mmHg — ABNORMAL LOW (ref 83.0–108.0)
pO2, Arterial: 235 mmHg — ABNORMAL HIGH (ref 83.0–108.0)

## 2020-07-22 LAB — BASIC METABOLIC PANEL
Anion gap: 9 (ref 5–15)
BUN: 40 mg/dL — ABNORMAL HIGH (ref 8–23)
CO2: 25 mmol/L (ref 22–32)
Calcium: 9 mg/dL (ref 8.9–10.3)
Chloride: 111 mmol/L (ref 98–111)
Creatinine, Ser: 2.61 mg/dL — ABNORMAL HIGH (ref 0.44–1.00)
GFR, Estimated: 19 mL/min — ABNORMAL LOW (ref >60)
Glucose, Bld: 110 mg/dL — ABNORMAL HIGH (ref 70–99)
Potassium: 4.4 mmol/L (ref 3.5–5.1)
Sodium: 145 mmol/L (ref 135–145)

## 2020-07-22 LAB — RESP PANEL BY RT-PCR (FLU A&B, COVID) ARPGX2
Influenza A by PCR: NEGATIVE
Influenza B by PCR: NEGATIVE
SARS Coronavirus 2 by RT PCR: NEGATIVE

## 2020-07-22 LAB — GLUCOSE, CAPILLARY
Glucose-Capillary: 106 mg/dL — ABNORMAL HIGH (ref 70–99)
Glucose-Capillary: 100 mg/dL — ABNORMAL HIGH (ref 70–99)
Glucose-Capillary: 111 mg/dL — ABNORMAL HIGH (ref 70–99)
Glucose-Capillary: 117 mg/dL — ABNORMAL HIGH (ref 70–99)

## 2020-07-22 LAB — CBC WITH DIFFERENTIAL/PLATELET
Abs Immature Granulocytes: 0.15 10*3/uL — ABNORMAL HIGH (ref 0.00–0.07)
Basophils Absolute: 0.1 10*3/uL (ref 0.0–0.1)
Basophils Relative: 1 %
Eosinophils Absolute: 0 10*3/uL (ref 0.0–0.5)
Eosinophils Relative: 0 %
HCT: 24.7 % — ABNORMAL LOW (ref 36.0–46.0)
Hemoglobin: 8.1 g/dL — ABNORMAL LOW (ref 12.0–15.0)
Immature Granulocytes: 1 %
Lymphocytes Relative: 12 %
Lymphs Abs: 1.4 10*3/uL (ref 0.7–4.0)
MCH: 31 pg (ref 26.0–34.0)
MCHC: 32.8 g/dL (ref 30.0–36.0)
MCV: 94.6 fL (ref 80.0–100.0)
Monocytes Absolute: 0.5 10*3/uL (ref 0.1–1.0)
Monocytes Relative: 4 %
Neutro Abs: 10 10*3/uL — ABNORMAL HIGH (ref 1.7–7.7)
Neutrophils Relative %: 82 %
Platelets: 91 10*3/uL — ABNORMAL LOW (ref 150–400)
RBC: 2.61 MIL/uL — ABNORMAL LOW (ref 3.87–5.11)
RDW: 16.1 % — ABNORMAL HIGH (ref 11.5–15.5)
WBC: 12.2 10*3/uL — ABNORMAL HIGH (ref 4.0–10.5)
nRBC: 6.3 % — ABNORMAL HIGH (ref 0.0–0.2)

## 2020-07-22 LAB — COMPREHENSIVE METABOLIC PANEL
ALT: 37 U/L (ref 0–44)
AST: 47 U/L — ABNORMAL HIGH (ref 15–41)
Albumin: 2.3 g/dL — ABNORMAL LOW (ref 3.5–5.0)
Alkaline Phosphatase: 129 U/L — ABNORMAL HIGH (ref 38–126)
Anion gap: 10 (ref 5–15)
BUN: 38 mg/dL — ABNORMAL HIGH (ref 8–23)
CO2: 24 mmol/L (ref 22–32)
Calcium: 9 mg/dL (ref 8.9–10.3)
Chloride: 111 mmol/L (ref 98–111)
Creatinine, Ser: 2.22 mg/dL — ABNORMAL HIGH (ref 0.44–1.00)
GFR, Estimated: 23 mL/min — ABNORMAL LOW (ref >60)
Glucose, Bld: 108 mg/dL — ABNORMAL HIGH (ref 70–99)
Potassium: 4.1 mmol/L (ref 3.5–5.1)
Sodium: 145 mmol/L (ref 135–145)
Total Bilirubin: 1.1 mg/dL (ref 0.3–1.2)
Total Protein: 6.1 g/dL — ABNORMAL LOW (ref 6.5–8.1)

## 2020-07-22 LAB — CBC
HCT: 23.3 % — ABNORMAL LOW (ref 36.0–46.0)
Hemoglobin: 8.1 g/dL — ABNORMAL LOW (ref 12.0–15.0)
MCH: 31.9 pg (ref 26.0–34.0)
MCHC: 34.8 g/dL (ref 30.0–36.0)
MCV: 91.7 fL (ref 80.0–100.0)
Platelets: 82 10*3/uL — ABNORMAL LOW (ref 150–400)
RBC: 2.54 MIL/uL — ABNORMAL LOW (ref 3.87–5.11)
RDW: 16.1 % — ABNORMAL HIGH (ref 11.5–15.5)
WBC: 10.9 10*3/uL — ABNORMAL HIGH (ref 4.0–10.5)
nRBC: 4.5 % — ABNORMAL HIGH (ref 0.0–0.2)

## 2020-07-22 LAB — THYROID PANEL WITH TSH
Free Thyroxine Index: 2 (ref 1.2–4.9)
T3 Uptake Ratio: 28 % (ref 24–39)
T4, Total: 7.1 ug/dL (ref 4.5–12.0)
TSH: 5.78 u[IU]/mL — ABNORMAL HIGH (ref 0.450–4.500)

## 2020-07-22 LAB — MAGNESIUM: Magnesium: 2 mg/dL (ref 1.7–2.4)

## 2020-07-22 LAB — TROPONIN I (HIGH SENSITIVITY): Troponin I (High Sensitivity): 83 ng/L — ABNORMAL HIGH (ref <18)

## 2020-07-22 LAB — BRAIN NATRIURETIC PEPTIDE: B Natriuretic Peptide: 1516.5 pg/mL — ABNORMAL HIGH (ref 0.0–100.0)

## 2020-07-22 LAB — LACTIC ACID, PLASMA: Lactic Acid, Venous: 1.7 mmol/L (ref 0.5–1.9)

## 2020-07-22 MED ORDER — ROCURONIUM BROMIDE 50 MG/5ML IV SOLN
INTRAVENOUS | Status: AC
  Administered 2020-07-22: 22:00:00 75 mg via INTRAVENOUS
  Filled 2020-07-22: qty 1

## 2020-07-22 MED ORDER — FENTANYL CITRATE (PF) 100 MCG/2ML IJ SOLN: 25.0000 ug | INTRAMUSCULAR | Status: DC | PRN

## 2020-07-22 MED ORDER — SODIUM CHLORIDE 0.9% FLUSH
10.0000 mL | Freq: Two times a day (BID) | INTRAVENOUS | Status: DC
  Administered 2020-07-23 – 2020-07-24 (×3): 10 mL

## 2020-07-22 MED ORDER — PHENYLEPHRINE CONCENTRATED 100MG/250ML (0.4 MG/ML) INFUSION SIMPLE
0.0000 ug/min | INTRAVENOUS | Status: DC
  Filled 2020-07-22: qty 250

## 2020-07-22 MED ORDER — ETOMIDATE 2 MG/ML IV SOLN: 25.0000 mg | Freq: Once | INTRAVENOUS | Status: AC

## 2020-07-22 MED ORDER — VASOPRESSIN 20 UNITS/100 ML INFUSION FOR SHOCK
0.0000 [IU]/min | INTRAVENOUS | Status: DC
  Administered 2020-07-23 – 2020-07-24 (×3): 0.03 [IU]/min via INTRAVENOUS
  Filled 2020-07-22 (×3): qty 100

## 2020-07-22 MED ORDER — ETOMIDATE 2 MG/ML IV SOLN
INTRAVENOUS | Status: AC
  Administered 2020-07-22: 22:00:00 25 mg via INTRAVENOUS
  Filled 2020-07-22: qty 10

## 2020-07-22 MED ORDER — MIDAZOLAM HCL 2 MG/2ML IJ SOLN: 1.0000 mg | INTRAMUSCULAR | Status: DC | PRN

## 2020-07-22 MED ORDER — ROCURONIUM BROMIDE 50 MG/5ML IV SOLN: 75.0000 mg | Freq: Once | INTRAVENOUS | Status: AC

## 2020-07-22 MED ORDER — NOREPINEPHRINE 4 MG/250ML-% IV SOLN
0.0000 ug/min | INTRAVENOUS | Status: DC
  Administered 2020-07-22: 21:00:00 5 ug/min via INTRAVENOUS
  Administered 2020-07-23 (×2): 35 ug/min via INTRAVENOUS
  Filled 2020-07-22 (×2): qty 250

## 2020-07-22 MED ORDER — SODIUM CHLORIDE 0.9 % IV SOLN
1.0000 g | Freq: Three times a day (TID) | INTRAVENOUS | Status: DC
  Administered 2020-07-23 (×2): 1 g via INTRAVENOUS
  Filled 2020-07-22 (×4): qty 1

## 2020-07-22 MED ORDER — LACTATED RINGERS IV BOLUS
1000.0000 mL | Freq: Once | INTRAVENOUS | Status: AC
  Administered 2020-07-22: 14:00:00 1000 mL via INTRAVENOUS

## 2020-07-22 MED ORDER — VANCOMYCIN HCL 750 MG/150ML IV SOLN
750.0000 mg | INTRAVENOUS | Status: DC
  Filled 2020-07-22: qty 150

## 2020-07-22 MED ORDER — ETOMIDATE 2 MG/ML IV SOLN
INTRAVENOUS | Status: AC
  Filled 2020-07-22: qty 10

## 2020-07-22 MED ORDER — SODIUM CHLORIDE 0.9% FLUSH: 10.0000 mL | INTRAVENOUS | Status: DC | PRN

## 2020-07-22 MED ORDER — VANCOMYCIN HCL 1750 MG/350ML IV SOLN
1750.0000 mg | Freq: Once | INTRAVENOUS | Status: AC
  Administered 2020-07-23: 01:00:00 1750 mg via INTRAVENOUS
  Filled 2020-07-22: qty 350

## 2020-07-22 MED ORDER — METOPROLOL TARTRATE 5 MG/5ML IV SOLN
5.0000 mg | Freq: Once | INTRAVENOUS | Status: AC
  Administered 2020-07-22: 5 mg via INTRAVENOUS

## 2020-07-22 MED ORDER — MIDAZOLAM HCL 2 MG/2ML IJ SOLN
INTRAMUSCULAR | Status: AC
  Filled 2020-07-22: qty 2

## 2020-07-22 MED ORDER — POLYETHYLENE GLYCOL 3350 17 G PO PACK
17.0000 g | PACK | Freq: Every day | ORAL | Status: DC
  Administered 2020-07-24: 09:00:00 17 g
  Filled 2020-07-22: qty 1

## 2020-07-22 MED ORDER — FENTANYL 2500MCG IN NS 250ML (10MCG/ML) PREMIX INFUSION: 25.0000 ug/h | INTRAVENOUS | Status: DC

## 2020-07-22 MED ORDER — VANCOMYCIN HCL IN DEXTROSE 1-5 GM/200ML-% IV SOLN
1000.0000 mg | INTRAVENOUS | Status: DC
Start: 2020-07-24 — End: 2020-07-22

## 2020-07-22 MED ORDER — ROCURONIUM BROMIDE 50 MG/5ML IV SOLN
INTRAVENOUS | Status: AC
  Filled 2020-07-22: qty 1

## 2020-07-22 MED ORDER — FENTANYL 2500MCG IN NS 250ML (10MCG/ML) PREMIX INFUSION
INTRAVENOUS | Status: AC
  Administered 2020-07-22: 22:00:00 50 ug/h via INTRAVENOUS
  Filled 2020-07-22: qty 250

## 2020-07-22 MED ORDER — FENTANYL BOLUS VIA INFUSION
25.0000 ug | INTRAVENOUS | Status: DC | PRN
  Filled 2020-07-22: qty 25

## 2020-07-22 MED ORDER — FENTANYL CITRATE (PF) 100 MCG/2ML IJ SOLN
25.0000 ug | Freq: Once | INTRAMUSCULAR | Status: DC
Start: 2020-07-22 — End: 2020-07-23

## 2020-07-22 MED ORDER — MIDAZOLAM 50MG/50ML (1MG/ML) PREMIX INFUSION
0.5000 mg/h | INTRAVENOUS | Status: DC
  Administered 2020-07-22 – 2020-07-23 (×2): 2 mg/h via INTRAVENOUS
  Filled 2020-07-22 (×3): qty 50

## 2020-07-22 MED ORDER — DOCUSATE SODIUM 50 MG/5ML PO LIQD
100.0000 mg | Freq: Two times a day (BID) | ORAL | Status: DC
  Administered 2020-07-24: 09:00:00 100 mg
  Filled 2020-07-22: qty 10

## 2020-07-22 MED ORDER — HALOPERIDOL LACTATE 5 MG/ML IJ SOLN: 2.0000 mg | Freq: Four times a day (QID) | INTRAMUSCULAR | Status: DC | PRN

## 2020-07-22 NOTE — Progress Notes (Signed)
PHARMACY CONSULT NOTE  Pharmacy Consult for Electrolyte Monitoring and Replacement   Recent Labs: Potassium (mmol/L)  Date Value  07/22/2020 4.4   Magnesium (mg/dL)  Date Value  74/82/7078 2.0   Calcium (mg/dL)  Date Value  67/54/4920 9.0   Albumin (g/dL)  Date Value  05/01/1218 2.2 (L)   Phosphorus (mg/dL)  Date Value  75/88/3254 3.7   Sodium (mmol/L)  Date Value  07/22/2020 145    Assessment:  74 y.o. female with a hx of essential hypertension, hyperlipidemia, chronic kidney disease, COPD and recent memory problems. Current problems include AKI, hypotension, bradycardia since resolved.  MIVF: 10% dextrose @ 30 mL/hr  Goal of Therapy:  Electrolytes WNL  Plan:   No electrolyte replacement warranted today. Sodium trending up to ULN, continue to monitor.  Re-check electrolytes in AM  Anayansi Rundquist B Eileen Croswell 07/22/2020 9:00 AM

## 2020-07-22 NOTE — Progress Notes (Signed)
2000 Pt noted to have increased WOB, does not follow commands, spO2 in low 80s. MD made aware.  2020: Received orders for stat ABG, MD states he will place orders for further labs. Pt sats remain below 80%. bipap fio2 increased to 100%

## 2020-07-22 NOTE — Progress Notes (Signed)
Etomidate 25mg  given @ 2124 Rocuronium 75mg  given @2124   Pt intubated by 2125, MD @ 2125.  D. Sobi, RT at bedside as well.  7.5 ETT, 22cm @ the lip. Positive color change noted. Confirmed placement with CXR.

## 2020-07-22 NOTE — Progress Notes (Signed)
SLP Cancellation Note  Patient Details Name: Julie Roberts MRN: [id number] DOB: 28-Apr-1946   Cancelled treatment:       Reason Eval/Treat Not Completed: Medical issues which prohibited therapy;Fatigue/lethargy limiting ability to participate;Patient not medically ready (chart reviewed; consulted MD). Per report, pt remains on Precedex and currently lethargic/sleepy. Pt is not currently appropriate for BSE until she can be fully awake/alert and able to engage in the BSE. MD agreed. ST services will f/u tomorrow. Recommend frequent oral care for hygiene and stimulation of swallowing.      Jerilynn Som, MS, CCC-SLP Speech Language Pathologist Rehab Services [phone] [facility] 07/22/2020, 12:43 PM

## 2020-07-22 NOTE — Progress Notes (Signed)
CRITICAL CARE PROGRESS NOTE    Name: Julie Roberts MRN: [id number] DOB: Oct 13, 1945     LOS: 4   SUBJECTIVE FINDINGS & SIGNIFICANT EVENTS    Patient description:   74 yo F PMH as below, daughter gives history as patient is with encephalopathy.  Daughter states despite significant comorbid history she was able to drive 1 month ago and does all ADLs on her own.  She has been unsteady on feet with disequilibrium over past 2 months and she has had MRI brian for possible CVA on outpatient.  She was brought to ED due to worsening dizziness and altered mentation and found to have profound bradycardia in the teens.She had transcutaneous pacing placed. She was emergently seen by cardiology who was able to place transvenous TPM.  She was also noted to have A/CKD and hypothermia with admission to MICU for additional evaluation and management.    07/19/20- patient is improved.  I spoke with Dr Acie Fredrickson regarding cardiology plan-TPM removed from [facility]. Daughter at bedside reviewed care plan. Neurology consult for confusion.   07/20/20- patient on BIPAP.  Plan to wean precedex today and remove NIV.  Met with daughter of patient [name] and [name].    07/21/20-Patient able to answer simple question and follow commands  07/22/20-Patient with intermittent tachycardia overnight. Got precedex for agitation as well.    Lines/tubes :   Microbiology/Sepsis markers: Results for orders placed or performed during the hospital encounter of 07/15/2020  Blood culture (routine single)     Status: None (Preliminary result)   Collection Time: 06/25/2020  3:37 PM   Specimen: BLOOD  Result Value Ref Range Status   Specimen Description BLOOD BLOOD LEFT HAND  Final   Special Requests   Final    BOTTLES DRAWN AEROBIC AND ANAEROBIC Blood Culture  results may not be optimal due to an inadequate volume of blood received in culture bottles   Culture   Final    NO GROWTH 4 DAYS Performed at [facility], [address], [place], [place] [address]    Report Status PENDING  Incomplete  Resp Panel by RT-PCR (Flu A&B, Covid) Nasopharyngeal Swab     Status: None   Collection Time: 07/12/2020  4:00 PM   Specimen: Nasopharyngeal Swab; Nasopharyngeal(NP) swabs in vial transport medium  Result Value Ref Range Status   SARS Coronavirus 2 by RT PCR NEGATIVE NEGATIVE Final    Comment: (NOTE) SARS-CoV-2 target nucleic acids are NOT DETECTED.  The SARS-CoV-2 RNA is generally detectable in upper respiratory specimens during the acute phase of infection. The lowest concentration of SARS-CoV-2 viral copies this assay can detect is 138 copies/mL. A negative result does not preclude SARS-Cov-2 infection and should not be used as the sole basis for treatment or other patient management decisions. A negative result may occur with  improper specimen collection/handling, submission of specimen other than nasopharyngeal swab, presence of viral mutation(s) within the areas targeted by this assay, and inadequate number of viral copies(<138 copies/mL). A negative result must be combined with clinical observations, patient history, and epidemiological information. The expected result is Negative.  Fact Sheet for Patients:  EntrepreneurPulse.com.au  Fact Sheet for Healthcare Providers:  IncredibleEmployment.be  This test is no t yet approved or cleared by the [place] FDA and  has been authorized for detection and/or diagnosis of SARS-CoV-2 by FDA under an Emergency Use Authorization (EUA). This EUA will remain  in effect (meaning this test can be used) for the duration of the COVID-19  declaration under Section 564(b)(1) of the Act, 21 U.S.C.section 360bbb-3(b)(1), unless the authorization is terminated   or revoked sooner.       Influenza A by PCR NEGATIVE NEGATIVE Final   Influenza B by PCR NEGATIVE NEGATIVE Final    Comment: (NOTE) The Xpert Xpress SARS-CoV-2/FLU/RSV plus assay is intended as an aid in the diagnosis of influenza from Nasopharyngeal swab specimens and should not be used as a sole basis for treatment. Nasal washings and aspirates are unacceptable for Xpert Xpress SARS-CoV-2/FLU/RSV testing.  Fact Sheet for Patients: EntrepreneurPulse.com.au  Fact Sheet for Healthcare Providers: IncredibleEmployment.be  This test is not yet approved or cleared by the [place] FDA and has been authorized for detection and/or diagnosis of SARS-CoV-2 by FDA under an Emergency Use Authorization (EUA). This EUA will remain in effect (meaning this test can be used) for the duration of the COVID-19 declaration under Section 564(b)(1) of the Act, 21 U.S.C. section 360bbb-3(b)(1), unless the authorization is terminated or revoked.  Performed at [facility], [address] [place], [place] [address]   MRSA PCR Screening     Status: None   Collection Time: 07/22/2020  5:47 PM   Specimen: Nasopharyngeal  Result Value Ref Range Status   MRSA by PCR NEGATIVE NEGATIVE Final    Comment:        The GeneXpert MRSA Assay (FDA approved for NASAL specimens only), is one component of a comprehensive MRSA colonization surveillance program. It is not intended to diagnose MRSA infection nor to guide or monitor treatment for MRSA infections. Performed at [facility], [address], [place], [place] [address]   Urine culture     Status: Abnormal   Collection Time: 07/19/20  4:10 AM   Specimen: In/Out Cath Urine  Result Value Ref Range Status   Specimen Description   Final    IN/OUT CATH URINE Performed at [facility], [address] [place], [place] [address]    Special Requests   Final    NONE Performed at  [facility], [address] [place], [place] [address]    Culture 60,000 COLONIES/mL ENTEROBACTER AEROGENES (A)  Final   Report Status 07/21/2020 FINAL  Final   Organism ID, Bacteria ENTEROBACTER AEROGENES (A)  Final      Susceptibility   Enterobacter aerogenes - MIC*    CEFAZOLIN RESISTANT Resistant     CEFEPIME <=0.12 SENSITIVE Sensitive     CEFTRIAXONE <=0.25 SENSITIVE Sensitive     CIPROFLOXACIN <=0.25 SENSITIVE Sensitive     GENTAMICIN <=1 SENSITIVE Sensitive     IMIPENEM <=0.25 SENSITIVE Sensitive     NITROFURANTOIN 32 SENSITIVE Sensitive     TRIMETH/SULFA <=20 SENSITIVE Sensitive     PIP/TAZO <=4 SENSITIVE Sensitive     * 60,000 COLONIES/mL ENTEROBACTER AEROGENES    Anti-infectives:  Anti-infectives (From admission, onward)   Start     Dose/Rate Route Frequency Ordered Stop   07/21/20 1800  ciprofloxacin (CIPRO) IVPB 400 mg        400 mg 200 mL/hr over 60 Minutes Intravenous Every 24 hours 07/21/20 1500     07/20/20 2215  meropenem (MERREM) 500 mg in sodium chloride 0.9 % 100 mL IVPB  Status:  Discontinued        500 mg 200 mL/hr over 30 Minutes Intravenous Every 12 hours 07/20/20 2120 07/21/20 1457   07/20/20 2200  ceFEPIme (MAXIPIME) 2 g in sodium chloride 0.9 % 100 mL IVPB  Status:  Discontinued  2 g 200 mL/hr over 30 Minutes Intravenous Every 24 hours 07/20/20 2105 07/20/20 2118   07/19/20 0500  doxycycline (VIBRAMYCIN) 100 mg in sodium chloride 0.9 % 250 mL IVPB  Status:  Discontinued        100 mg 125 mL/hr over 120 Minutes Intravenous Every 12 hours 07/19/20 0357 07/20/20 1112       Consults: Treatment Team:  Murlean Iba, MD Leotis Pain, MD      PAST MEDICAL HISTORY   Past Medical History:  Diagnosis Date  . Asthma   . Hypertension      SURGICAL HISTORY   Past Surgical History:  Procedure Laterality Date  . BREAST BIOPSY Left 2013   neg  . TEMPORARY PACEMAKER N/A 06/28/2020   Procedure: TEMPORARY PACEMAKER;   Surgeon: Wellington Hampshire, MD;  Location: [facility];  Service: Cardiovascular;  Laterality: N/A;     FAMILY HISTORY   Family History  Problem Relation Age of Onset  . Breast cancer Neg Hx      SOCIAL HISTORY       MEDICATIONS   Current Medication:  Current Facility-Administered Medications:  .  0.9 %  sodium chloride infusion, , Intravenous, PRN, Rust-Chester, Huel Cote, NP, Stopped at 07/21/20 1708 .  Chlorhexidine Gluconate Cloth 2 % PADS 6 each, 6 each, Topical, Q0600, Ottie Glazier, MD, 6 each at 07/22/20 0554 .  ciprofloxacin (CIPRO) IVPB 400 mg, 400 mg, Intravenous, Q24H, Tyna Jaksch, MD, Stopped at 07/21/20 1809 .  dexmedetomidine (PRECEDEX) 400 MCG/100ML (4 mcg/mL) infusion, 0.4-1.2 mcg/kg/hr, Intravenous, Titrated, Rust-Chester, Britton L, NP, Last Rate: 11.45 mL/hr at 07/22/20 1203, 0.6 mcg/kg/hr at 07/22/20 1203 .  dextrose 10 % infusion, , Intravenous, Continuous, Aleskerov, Fuad, MD, Last Rate: 30 mL/hr at 07/22/20 0600, Infusion Verify at 07/22/20 0600 .  haloperidol lactate (HALDOL) injection 2 mg, 2 mg, Intravenous, Q6H PRN, Tyna Jaksch, MD .  heparin sodium (porcine) injection 1,000-6,000 Units, 1,000-6,000 Units, Intravenous, PRN, Awilda Bill, NP .  morphine 2 MG/ML injection 0.5-1 mg, 0.5-1 mg, Intravenous, Q4H PRN, Darel Hong D, NP, 1 mg at 07/22/20 1111 .  ondansetron (ZOFRAN) injection 4 mg, 4 mg, Intravenous, Q6H PRN, Rust-Chester, Toribio Harbour L, NP    ALLERGIES   Penicillins and Ace inhibitors    REVIEW OF SYSTEMS    Unable to obtain due to encephalopathy  PHYSICAL EXAMINATION   Vital Signs: Temp:  [95.72 F (35.4 C)-99.68 F (37.6 C)] 97.9 F (36.6 C) (12/28 1200) Pulse Rate:  [46-124] 61 (12/28 1200) Resp:  [12-32] 32 (12/28 1200) BP: (90-122)/(43-109) 90/69 (12/28 1200) SpO2:  [80 %-95 %] 88 % (12/28 1200)  GENERAL:age appropriate HEAD: Normocephalic, atraumatic.  EYES: Pupils equal, round,  reactive to light.  No scleral icterus.  MOUTH: Moist mucosal membrane. NECK: Supple. No thyromegaly. No nodules. No JVD.  PULMONARY: decreased air entry bilaterally  CARDIOVASCULAR: S1 and S2. Regular rate and rhythm. No murmurs, rubs, or gallops.  GASTROINTESTINAL: Soft, nontender, non-distended. No masses. Positive bowel sounds. No hepatosplenomegaly.  MUSCULOSKELETAL: No swelling, clubbing, or edema.  NEUROLOGIC: GCS10 SKIN:intact,warm,dry   PERTINENT DATA     Infusions: . sodium chloride Stopped (07/21/20 1708)  . ciprofloxacin Stopped (07/21/20 1809)  . dexmedetomidine (PRECEDEX) IV infusion 0.6 mcg/kg/hr (07/22/20 1203)  . dextrose 30 mL/hr at 07/22/20 0600   Scheduled Medications: . Chlorhexidine Gluconate Cloth  6 each Topical Q0600   PRN Medications:  Hemodynamic parameters:   Intake/Output: 12/27 0701 - 12/28 0700 In: 1367.6 [I.V.:1067.6;  IV Piggyback:300] Out: 1025 [Urine:1025]  Ventilator  Settings:     LAB RESULTS:  Basic Metabolic Panel: Recent Labs  Lab 07/19/20 0426 07/19/20 1405 07/19/20 1527 07/20/20 0416 07/21/20 0609 07/22/20 0555  NA 132* 135  --  141 141 145  K 4.4 6.3*   < > 5.0 4.7 4.4  CL 102 103  --  106 108 111  CO2 19* 19*  --  '26 25 25  ' GLUCOSE 118* 119*  --  98 87 110*  BUN 57* 58*  --  54* 46* 40*  CREATININE 3.74* 3.75*  --  3.17* 2.72* 2.61*  CALCIUM 8.9 9.2  --  8.8* 8.9 9.0  MG 1.9  --   --  1.8 2.3 2.0  PHOS 1.8* 4.8*  --  4.3 3.7  --    < > = values in this interval not displayed.   Liver Function Tests: Recent Labs  Lab 07/06/2020 1600 07/19/20 0426 07/20/20 0416 07/21/20 0609  AST 61*  --   --   --   ALT 56*  --   --   --   ALKPHOS 152*  --   --   --   BILITOT 0.5  --   --   --   PROT 6.2*  --   --   --   ALBUMIN 3.1* 2.4* 2.4* 2.2*   No results for input(s): LIPASE, AMYLASE in the last 168 hours. Recent Labs  Lab 07/19/20 2025  AMMONIA 16   CBC: Recent Labs  Lab 07/01/2020 1600 07/19/20 0426  07/20/20 0416 07/21/20 0609 07/22/20 0555  WBC 11.4* 7.3 8.7 10.0 10.9*  NEUTROABS 8.5* 6.5  --   --   --   HGB 8.6* 8.8* 8.7* 7.8* 8.1*  HCT 25.3* 26.6* 24.2* 22.9* 23.3*  MCV 92.3 93.7 88.0 92.0 91.7  PLT 142* 152 101* 80* 82*   Cardiac Enzymes: No results for input(s): CKTOTAL, CKMB, CKMBINDEX, TROPONINI in the last 168 hours. BNP: Invalid input(s): POCBNP CBG: Recent Labs  Lab 07/21/20 1943 07/21/20 2328 07/22/20 0327 07/22/20 0741 07/22/20 1201  GLUCAP 83 110* 106* 100* 111*       IMAGING RESULTS:  Imaging: DG Chest Port 1 View  Result Date: 07/21/2020 CLINICAL DATA:  Altered mental status. Acute kidney injury. Bradycardia. EXAM: PORTABLE CHEST 1 VIEW COMPARISON:  07/19/2020 FINDINGS: Right jugular central venous catheter remains in appropriate position. Asymmetric airspace disease is again seen involving right lung greater than left, without significant change. No evidence of pleural effusion or pneumothorax. Heart size remains normal. IMPRESSION: No significant change in asymmetric airspace disease, right lung greater than left. Electronically Signed   By: Marlaine Hind M.D.   On: 07/21/2020 08:25   '@PROBHOSP' @ No results found.   ASSESSMENT AND PLAN    -Multidisciplinary rounds held today    Altered mental status with encephalopathy -Patient with waxing and waning mental status gets delirious very easy. Will put in for seroquel to help wean precedex that was increased overnight.  -Morphine PRN for pain    Acute on chronic renal failure  - d/c nonessential nephrotoxins ICU monitoring -follow chem 7 -follow UO -continue Foley Catheter-assess need daily    Symptomatic bradyarrythmia   Cardiology on case - Dr Fletcher Anon - appreciate input - likely due to CKD with reaccumulation of BB -please follow cardiology recommendations -Has improved since admission.  Sinus tachycardia/ectopic atrial tachycardia-in 120's  Likely from low volume status  1 liter  bolus  ID/UIT meropenem switched to  cipro for enterobacter in the urine.   GI/Nutrition GI PROPHYLAXIS as indicated DIET-->TF's as tolerated Constipation protocol as indicated  ENDO - ICU hypoglycemic\Hyperglycemia protocol -check FSBS per protocol -Patient will need speech eval on 07-22-20   ELECTROLYTES -follow labs as needed -replace as needed -pharmacy consultation   DVT/GI PRX ordered -SCDs  TRANSFUSIONS AS NEEDED MONITOR FSBS ASSESS the need for LABS as needed   Critical care provider statement:    Critical care time (minutes):  35   Critical care time was exclusive of:  Separately billable procedures and treating other patients   Critical care was necessary to treat or prevent imminent or life-threatening deterioration of the following conditions:  altered mental status, encephalopathy, symptomatic bradyarrythmia, Acute on chronic renal failure, multiple comorbid conditions.   Critical care was time spent personally by me on the following activities:  Development of treatment plan with patient or surrogate, discussions with consultants, evaluation of patient's response to treatment, examination of patient, obtaining history from patient or surrogate, ordering and performing treatments and interventions, ordering and review of laboratory studies and re-evaluation of patient's condition.  I assumed direction of critical care for this patient from another provider in my specialty: no    This document was prepared using Dragon voice recognition software and may include unintentional dictation errors.   Newell Coral DO Internal Medicine/Pediatrics Pulmonary and Critical Care Fellow PGY-7

## 2020-07-22 NOTE — Progress Notes (Signed)
[facility] [place], [place] 07/22/20  Subjective:   LOS: 4 12/27 0701 - 12/28 0700 In: 1367.6 [I.V.:1067.6; IV Piggyback:300] Out: 1025 [Urine:1025]  Patient seen and evaluated at bedside in the critical care unit. Urine output was a little over 1 L over the preceding 24 hours. Currently off of BiPAP.  Objective:  Vital signs in last 24 hours:  Temp:  [95.72 F (35.4 C)-99.86 F (37.7 C)] 99.14 F (37.3 C) (12/28 0800) Pulse Rate:  [69-122] 122 (12/28 0800) Resp:  [12-37] 29 (12/28 0800) BP: (96-122)/(41-109) 112/65 (12/28 0800) SpO2:  [85 %-98 %] 89 % (12/28 0800)  Weight change:  Filed Weights   06/27/2020 1620 07/20/20 0500  Weight: 72.6 kg 76.3 kg    Intake/Output:    Intake/Output Summary (Last 24 hours) at 07/22/2020 0904 Last data filed at 07/22/2020 0600 Gross per 24 hour  Intake 1288.31 ml  Output 925 ml  Net 363.31 ml     Physical Exam: General: critically ill appearing  HEENT [place]/AT hearing intact  Pulm/lungs bilateral rales, normal effort.  CVS/Heart regular  Abdomen:  soft  Extremities: Trace edema  Neurologic: Awake, alert  Skin: No acute rashes  Access: Rt IJ temp cath  GU: Foley in place    Basic Metabolic Panel:  Recent Labs  Lab 07/19/20 0426 07/19/20 1405 07/19/20 1527 07/19/20 2021 07/20/20 0416 07/21/20 0609 07/22/20 0555  NA 132* 135  --   --  141 141 145  K 4.4 6.3* 5.9* 5.7* 5.0 4.7 4.4  CL 102 103  --   --  106 108 111  CO2 19* 19*  --   --  26 25 25   GLUCOSE 118* 119*  --   --  98 87 110*  BUN 57* 58*  --   --  54* 46* 40*  CREATININE 3.74* 3.75*  --   --  3.17* 2.72* 2.61*  CALCIUM 8.9 9.2  --   --  8.8* 8.9 9.0  MG 1.9  --   --   --  1.8 2.3 2.0  PHOS 1.8* 4.8*  --   --  4.3 3.7  --      CBC: Recent Labs  Lab 07/23/2020 1600 07/19/20 0426 07/20/20 0416 07/21/20 0609 07/22/20 0555  WBC 11.4* 7.3 8.7 10.0 10.9*  NEUTROABS 8.5* 6.5  --   --   --   HGB 8.6* 8.8* 8.7* 7.8* 8.1*  HCT 25.3*  26.6* 24.2* 22.9* 23.3*  MCV 92.3 93.7 88.0 92.0 91.7  PLT 142* 152 101* 80* 82*     No results found for: HEPBSAG, HEPBSAB, HEPBIGM    Microbiology:  Recent Results (from the past 240 hour(s))  Blood culture (routine single)     Status: None (Preliminary result)   Collection Time: 07/25/2020  3:37 PM   Specimen: BLOOD  Result Value Ref Range Status   Specimen Description BLOOD BLOOD LEFT HAND  Final   Special Requests   Final    BOTTLES DRAWN AEROBIC AND ANAEROBIC Blood Culture results may not be optimal due to an inadequate volume of blood received in culture bottles   Culture   Final    NO GROWTH 4 DAYS Performed at [facility], [address] [place], [place]    Report Status PENDING  Incomplete  Resp Panel by RT-PCR (Flu A&B, Covid) Nasopharyngeal Swab     Status: None   Collection Time: 07/06/2020  4:00 PM   Specimen: Nasopharyngeal Swab; Nasopharyngeal(NP) swabs in vial  transport medium  Result Value Ref Range Status   SARS Coronavirus 2 by RT PCR NEGATIVE NEGATIVE Final    Comment: (NOTE) SARS-CoV-2 target nucleic acids are NOT DETECTED.  The SARS-CoV-2 RNA is generally detectable in upper respiratory specimens during the acute phase of infection. The lowest concentration of SARS-CoV-2 viral copies this assay can detect is 138 copies/mL. A negative result does not preclude SARS-Cov-2 infection and should not be used as the sole basis for treatment or other patient management decisions. A negative result may occur with  improper specimen collection/handling, submission of specimen other than nasopharyngeal swab, presence of viral mutation(s) within the areas targeted by this assay, and inadequate number of viral copies(<138 copies/mL). A negative result must be combined with clinical observations, patient history, and epidemiological information. The expected result is Negative.  Fact Sheet for Patients:   BloggerCourse.com  Fact Sheet for Healthcare Providers:  SeriousBroker.it  This test is no t yet approved or cleared by the [place] FDA and  has been authorized for detection and/or diagnosis of SARS-CoV-2 by FDA under an Emergency Use Authorization (EUA). This EUA will remain  in effect (meaning this test can be used) for the duration of the COVID-19 declaration under Section 564(b)(1) of the Act, 21 U.S.C.section 360bbb-3(b)(1), unless the authorization is terminated  or revoked sooner.       Influenza A by PCR NEGATIVE NEGATIVE Final   Influenza B by PCR NEGATIVE NEGATIVE Final    Comment: (NOTE) The Xpert Xpress SARS-CoV-2/FLU/RSV plus assay is intended as an aid in the diagnosis of influenza from Nasopharyngeal swab specimens and should not be used as a sole basis for treatment. Nasal washings and aspirates are unacceptable for Xpert Xpress SARS-CoV-2/FLU/RSV testing.  Fact Sheet for Patients: BloggerCourse.com  Fact Sheet for Healthcare Providers: SeriousBroker.it  This test is not yet approved or cleared by the [place] FDA and has been authorized for detection and/or diagnosis of SARS-CoV-2 by FDA under an Emergency Use Authorization (EUA). This EUA will remain in effect (meaning this test can be used) for the duration of the COVID-19 declaration under Section 564(b)(1) of the Act, 21 U.S.C. section 360bbb-3(b)(1), unless the authorization is terminated or revoked.  Performed at [facility], [address] [place], [place] [address]   MRSA PCR Screening     Status: None   Collection Time: 07/12/2020  5:47 PM   Specimen: Nasopharyngeal  Result Value Ref Range Status   MRSA by PCR NEGATIVE NEGATIVE Final    Comment:        The GeneXpert MRSA Assay (FDA approved for NASAL specimens only), is one component of a comprehensive MRSA  colonization surveillance program. It is not intended to diagnose MRSA infection nor to guide or monitor treatment for MRSA infections. Performed at [facility], [address], [place], [place] [address]   Urine culture     Status: Abnormal   Collection Time: 07/19/20  4:10 AM   Specimen: In/Out Cath Urine  Result Value Ref Range Status   Specimen Description   Final    IN/OUT CATH URINE Performed at [facility], [address] [place], [place] [address]    Special Requests   Final    NONE Performed at [facility], [address] [place], [place] [address]    Culture 60,000 COLONIES/mL ENTEROBACTER AEROGENES (A)  Final   Report Status 07/21/2020 FINAL  Final   Organism ID, Bacteria ENTEROBACTER AEROGENES (A)  Final  Susceptibility   Enterobacter aerogenes - MIC*    CEFAZOLIN RESISTANT Resistant     CEFEPIME <=0.12 SENSITIVE Sensitive     CEFTRIAXONE <=0.25 SENSITIVE Sensitive     CIPROFLOXACIN <=0.25 SENSITIVE Sensitive     GENTAMICIN <=1 SENSITIVE Sensitive     IMIPENEM <=0.25 SENSITIVE Sensitive     NITROFURANTOIN 32 SENSITIVE Sensitive     TRIMETH/SULFA <=20 SENSITIVE Sensitive     PIP/TAZO <=4 SENSITIVE Sensitive     * 60,000 COLONIES/mL ENTEROBACTER AEROGENES    Coagulation Studies: No results for input(s): LABPROT, INR in the last 72 hours.  Urinalysis: No results for input(s): COLORURINE, LABSPEC, PHURINE, GLUCOSEU, HGBUR, BILIRUBINUR, KETONESUR, PROTEINUR, UROBILINOGEN, NITRITE, LEUKOCYTESUR in the last 72 hours.  Invalid input(s): APPERANCEUR    Imaging: US RENAL  Result Date: 07/20/2020 CLINICAL DATA:  Acute renal failure EXAM: RENAL / URINARY TRACT ULTRASOUND COMPLETE COMPARISON:  None. FINDINGS: Right Kidney: Renal measurements: 9.3 x 4.1 x 4.3 cm = volume: 87 mL. Increased echogenicity. No hydronephrosis. Left Kidney: Renal measurements: 7.6 x 5.4 x 5.1 cm = volume: 110 mL. Increased echogenicity. No  hydronephrosis. Bladder: Foley catheter within.  Bladder not entirely decompressed. Other: None. IMPRESSION: 1.  No hydronephrosis. 2. Increased renal echogenicity, suggesting medical renal disease. 3. Bladder not entirely decompressed by Foley catheter. Correlate with catheter function. Electronically Signed   By: Jeronimo Greaves M.D.   On: 07/20/2020 12:18   DG Chest Port 1 View  Result Date: 07/21/2020 CLINICAL DATA:  Altered mental status. Acute kidney injury. Bradycardia. EXAM: PORTABLE CHEST 1 VIEW COMPARISON:  07/19/2020 FINDINGS: Right jugular central venous catheter remains in appropriate position. Asymmetric airspace disease is again seen involving right lung greater than left, without significant change. No evidence of pleural effusion or pneumothorax. Heart size remains normal. IMPRESSION: No significant change in asymmetric airspace disease, right lung greater than left. Electronically Signed   By: Danae Orleans M.D.   On: 07/21/2020 08:25     Medications:   . sodium chloride Stopped (07/21/20 1708)  . ciprofloxacin Stopped (07/21/20 1809)  . dexmedetomidine (PRECEDEX) IV infusion 0.7 mcg/kg/hr (07/22/20 0612)  . dextrose 30 mL/hr at 07/22/20 0600   . Chlorhexidine Gluconate Cloth  6 each Topical Q0600   sodium chloride, heparin sodium (porcine), morphine injection, ondansetron (ZOFRAN) IV  Assessment/ Plan:  74 y.o. 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  Acute kidney injury on CKD st 2. Baseline creatinine 1.0/GFR 66 from January 17, 2020 Recent Labs    07/19/20 1405 07/20/20 0416 07/21/20 0609 07/22/20 0555  CREATININE 3.75* 3.17* 2.72* 2.61*  Urinalysis December 25 shows protein 100 mg/dL, 16-07 RBCs, 37-10 WBCs Covid panel is negative  AKI likely secondary to ATN due to hemodynamic instability  from bradycardia -Creatinine currently down to 2.6 with urine output of 1 L.  No immediate need for dialysis at the moment.  Continue to monitor renal parameters daily.  #. Anemia of CKD.  Lab Results  Component Value Date   HGB 8.1 (L) 07/22/2020   Hemoglobin up to 8.1.  May need Epogen as an outpatient.   #.  Bradycardia and hypotension Required Temporary pacemaker  Antihypertensives are on hold pressors are weaned off cortisol elevated  # Acute resp failure Patient currently weaned off of BiPAP and breathing on nasal cannula.    LOS: 4 Rajinder Mesick 12/28/20219:04 AM  [facility]  [place], [place]

## 2020-07-22 NOTE — Progress Notes (Signed)
Pharmacy Antibiotic Note  Julie Roberts is a 74 y.o. female admitted on 2020/07/30 with pneumonia.  Pharmacy has been consulted for Vancomycin dosing.  Plan: Vancomycin 1750 mg IV X 1 ordered for 12/28 @ ~ 2200.  Vancomycin 750 mg IV Q24H ordered to start on 12/30 @ ~ 2200.  CrCl = 21.8 ml/min   Height: 5\' 3"  (160 cm) Weight: 76.3 kg (168 lb 3.4 oz) IBW/kg (Calculated) : 52.4  Temp (24hrs), Avg:98.5 F (36.9 C), Min:96 F (35.6 C), Max:99.68 F (37.6 C)  Recent Labs  Lab July 30, 2020 1600 2020-07-30 2319 07/19/20 0426 07/19/20 1405 07/20/20 0416 07/21/20 0609 07/22/20 0555 07/22/20 2042  WBC 11.4*  --  7.3  --  8.7 10.0 10.9* 12.2*  CREATININE 3.31* 3.49* 3.74* 3.75* 3.17* 2.72* 2.61* 2.22*  LATICACIDVEN 3.3* 2.5*  --   --   --   --   --  1.7    Estimated Creatinine Clearance: 21.8 mL/min (A) (by C-G formula based on SCr of 2.22 mg/dL (H)).    Allergies  Allergen Reactions  . Penicillins Hives, Palpitations and Swelling    Pt states has swelling, hives, and palpitations when has any penicillins.   Pt states has swelling, hives, and palpitations when has any penicillins.   Pt states has swelling, hives, and palpitations when has any penicillins.     . Ace Inhibitors Other (See Comments)    Cough Cough     Antimicrobials this admission:   >>    >>   Dose adjustments this admission:   Microbiology results:  BCx:   UCx:    Sputum:    MRSA PCR:   Thank you for allowing pharmacy to be a part of this patient's care.  Alisyn Lequire D 07/22/2020 9:27 PM

## 2020-07-23 ENCOUNTER — Inpatient Hospital Stay: Payer: Medicare Other

## 2020-07-23 DIAGNOSIS — J9692 Respiratory failure, unspecified with hypercapnia: Secondary | ICD-10-CM

## 2020-07-23 DIAGNOSIS — J9691 Respiratory failure, unspecified with hypoxia: Secondary | ICD-10-CM

## 2020-07-23 DIAGNOSIS — R001 Bradycardia, unspecified: Secondary | ICD-10-CM | POA: Diagnosis not present

## 2020-07-23 LAB — BLOOD GAS, VENOUS
Acid-base deficit: 4.8 mmol/L — ABNORMAL HIGH (ref 0.0–2.0)
Acid-base deficit: 3.9 mmol/L — ABNORMAL HIGH (ref 0.0–2.0)
Acid-base deficit: 3.7 mmol/L — ABNORMAL HIGH (ref 0.0–2.0)
Bicarbonate: 25.8 mmol/L (ref 20.0–28.0)
Bicarbonate: 25 mmol/L (ref 20.0–28.0)
Bicarbonate: 23.4 mmol/L (ref 20.0–28.0)
FIO2: 1
FIO2: 1
FIO2: 0.4
MECHVT: 340 mL
MECHVT: 400 mL
MECHVT: 400 mL
O2 Saturation: 86.2 %
O2 Saturation: 89.5 %
O2 Saturation: 49.3 %
PEEP: 14 cmH2O
PEEP: 14 cmH2O
PEEP: 14 cmH2O
Patient temperature: 37
Patient temperature: 33.6
Patient temperature: 37
RATE: 22 resp/min
RATE: 22 resp/min
RATE: 30 resp/min
pCO2, Ven: 85 mmHg (ref 44.0–60.0)
pCO2, Ven: 67 mmHg — ABNORMAL HIGH (ref 44.0–60.0)
pCO2, Ven: 51 mmHg (ref 44.0–60.0)
pH, Ven: 7.09 — CL (ref 7.250–7.430)
pH, Ven: 7.23 — ABNORMAL LOW (ref 7.250–7.430)
pH, Ven: 7.27 (ref 7.250–7.430)
pO2, Ven: 71 mmHg — ABNORMAL HIGH (ref 32.0–45.0)
pO2, Ven: 57 mmHg — ABNORMAL HIGH (ref 32.0–45.0)
pO2, Ven: 31 mmHg — CL (ref 32.0–45.0)

## 2020-07-23 LAB — BLOOD GAS, ARTERIAL
Acid-base deficit: 4.3 mmol/L — ABNORMAL HIGH (ref 0.0–2.0)
Bicarbonate: 22.1 mmol/L (ref 20.0–28.0)
FIO2: 0.75
MECHVT: 400 mL
O2 Saturation: 86.7 %
PEEP: 14 cmH2O
Patient temperature: 37
RATE: 30 resp/min
pCO2 arterial: 46 mmHg (ref 32.0–48.0)
pH, Arterial: 7.29 — ABNORMAL LOW (ref 7.350–7.450)
pO2, Arterial: 59 mmHg — ABNORMAL LOW (ref 83.0–108.0)

## 2020-07-23 LAB — GLUCOSE, CAPILLARY
Glucose-Capillary: 104 mg/dL — ABNORMAL HIGH (ref 70–99)
Glucose-Capillary: 117 mg/dL — ABNORMAL HIGH (ref 70–99)
Glucose-Capillary: 118 mg/dL — ABNORMAL HIGH (ref 70–99)
Glucose-Capillary: 125 mg/dL — ABNORMAL HIGH (ref 70–99)
Glucose-Capillary: 141 mg/dL — ABNORMAL HIGH (ref 70–99)
Glucose-Capillary: 97 mg/dL (ref 70–99)
Glucose-Capillary: 97 mg/dL (ref 70–99)

## 2020-07-23 LAB — RENAL FUNCTION PANEL
Albumin: 2.3 g/dL — ABNORMAL LOW (ref 3.5–5.0)
Anion gap: 10 (ref 5–15)
BUN: 39 mg/dL — ABNORMAL HIGH (ref 8–23)
CO2: 23 mmol/L (ref 22–32)
Calcium: 9.6 mg/dL (ref 8.9–10.3)
Chloride: 109 mmol/L (ref 98–111)
Creatinine, Ser: 2.32 mg/dL — ABNORMAL HIGH (ref 0.44–1.00)
GFR, Estimated: 22 mL/min — ABNORMAL LOW (ref >60)
Glucose, Bld: 139 mg/dL — ABNORMAL HIGH (ref 70–99)
Phosphorus: 6 mg/dL — ABNORMAL HIGH (ref 2.5–4.6)
Potassium: 4.1 mmol/L (ref 3.5–5.1)
Sodium: 142 mmol/L (ref 135–145)

## 2020-07-23 LAB — CULTURE, BLOOD (SINGLE): Culture: NO GROWTH

## 2020-07-23 LAB — VITAMIN B12: Vitamin B-12: 2826 pg/mL — ABNORMAL HIGH (ref 180–914)

## 2020-07-23 LAB — TROPONIN I (HIGH SENSITIVITY): Troponin I (High Sensitivity): 164 ng/L (ref <18)

## 2020-07-23 LAB — CORTISOL: Cortisol, Plasma: 59.2 ug/dL

## 2020-07-23 LAB — MAGNESIUM: Magnesium: 2 mg/dL (ref 1.7–2.4)

## 2020-07-23 LAB — FOLATE: Folate: 17.6 ng/mL (ref >5.9)

## 2020-07-23 MED ORDER — ATROPINE SULFATE 1 MG/ML IJ SOLN
0.5000 mg | INTRAMUSCULAR | Status: AC
  Administered 2020-07-23: 01:00:00 0.5 mg via INTRAVENOUS

## 2020-07-23 MED ORDER — ALBUTEROL SULFATE (2.5 MG/3ML) 0.083% IN NEBU: 2.5000 mg | INHALATION_SOLUTION | RESPIRATORY_TRACT | Status: DC | PRN

## 2020-07-23 MED ORDER — SODIUM CHLORIDE 0.9 % IV SOLN
2.0000 g | INTRAVENOUS | Status: DC
  Administered 2020-07-24: 08:00:00 2 g via INTRAVENOUS
  Filled 2020-07-23 (×2): qty 2

## 2020-07-23 MED ORDER — IPRATROPIUM-ALBUTEROL 0.5-2.5 (3) MG/3ML IN SOLN
3.0000 mL | Freq: Four times a day (QID) | RESPIRATORY_TRACT | Status: DC
  Administered 2020-07-24 (×2): 3 mL via RESPIRATORY_TRACT
  Filled 2020-07-23 (×2): qty 3

## 2020-07-23 MED ORDER — ORAL CARE MOUTH RINSE
15.0000 mL | OROMUCOSAL | Status: DC
  Administered 2020-07-23 – 2020-07-24 (×7): 15 mL via OROMUCOSAL

## 2020-07-23 MED ORDER — CHLORHEXIDINE GLUCONATE 0.12% ORAL RINSE (MEDLINE KIT)
15.0000 mL | Freq: Two times a day (BID) | OROMUCOSAL | Status: DC
  Administered 2020-07-23 – 2020-07-24 (×2): 15 mL via OROMUCOSAL

## 2020-07-23 MED ORDER — NOREPINEPHRINE 16 MG/250ML-% IV SOLN
0.0000 ug/min | INTRAVENOUS | Status: DC
  Administered 2020-07-23 – 2020-07-24 (×4): 35 ug/min via INTRAVENOUS
  Filled 2020-07-23 (×5): qty 250

## 2020-07-23 NOTE — Progress Notes (Signed)
CRITICAL VALUE ALERT  Critical Value:  Troponin 164  Date & Time Notied:  07/23/20 @ 5:44  Provider Notified: Coralie Common, MD  Orders Received/Actions taken: Provider aware, no orders at this time

## 2020-07-23 NOTE — Progress Notes (Signed)
This RN was standing in patient's room when patient had sudden asystole on the monitor and was without a pulse. CPR immediately began. Dr. Coralie Common immediately to bedside. Pt received epi x 1, 2 amps bicarb, and CaCl x 1. ROSC achieved. See code documentation in paper chart.

## 2020-07-23 NOTE — Progress Notes (Signed)
PHARMACY CONSULT NOTE  Pharmacy Consult for Electrolyte Monitoring and Replacement   Recent Labs: Potassium (mmol/L)  Date Value  07/23/2020 4.1   Magnesium (mg/dL)  Date Value  16/04/9603 2.0   Calcium (mg/dL)  Date Value  54/03/8118 9.6   Albumin (g/dL)  Date Value  14/78/2956 2.3 (L)   Phosphorus (mg/dL)  Date Value  21/30/8657 6.0 (H)   Sodium (mmol/L)  Date Value  07/23/2020 142    Assessment:  74 y.o. female with a hx of essential hypertension, hyperlipidemia, chronic kidney disease, COPD and recent memory problems. Current problems include AKI, hypotension, bradycardia. Patient's condition deteriorated the evening of 12/28 when patient became severely hypoxemic and went into sudden asystole requiring ACLS with achievement of ROSC after 4 minutes. She was further intubated and remains intubated, sedated, and on MV. On the early morning of 12/29 patient became bradycardic to the 20s which improved to the high 40s with atropine. Renal function is worsening. Nephrology following and considering RRT if no improvement.   MIVF: 10% dextrose @ 30 mL/hr  Goal of Therapy:  Electrolytes WNL  Plan:   No electrolyte replacement warranted today  Re-check electrolytes in AM  Julie Roberts 07/23/2020 9:03 AM

## 2020-07-23 NOTE — Progress Notes (Signed)
Notified by RT that pt's PO2 is 31%, increased FIO2 to 60 per RT request. Notified NP of PO2, increased FIO2 to 75% per NP.

## 2020-07-23 NOTE — Progress Notes (Signed)
PHARMACY NOTE:  ANTIMICROBIAL RENAL DOSAGE ADJUSTMENT  Current antimicrobial regimen includes a mismatch between antimicrobial dosage and estimated renal function.  As per policy approved by the Pharmacy & Therapeutics and Medical Executive Committees, the antimicrobial dosage will be adjusted accordingly.  Current antimicrobial dosage:  Cefepime 1gm IV q8h  Indication: Pneumonia  Renal Function:  Estimated Creatinine Clearance: 20.8 mL/min (A) (by C-G formula based on SCr of 2.32 mg/dL (H)). []      On intermittent HD, scheduled: []      On CRRT    Antimicrobial dosage has been changed to:  Cefepime 2gm IV q24h  Additional comments:   Thank you for allowing pharmacy to be a part of this patient's care.  , PharmD, BCPS.   Work Cell: [phone] 07/23/2020 1:05 PM

## 2020-07-23 NOTE — Progress Notes (Signed)
SLP Cancellation Note  Patient Details Name: Julie Roberts MRN: [id number] DOB: [birth year]   Cancelled treatment:       Reason Eval/Treat Not Completed: Medical issues which prohibited therapy;Patient not medically ready (chart reviewed; change of status). Consulted CCU; pt had a decline in status and is now orally intubated post a Code Blue event. ST services will sign off at this time. MD to reconsult service when medical status indicates.      Jerilynn Som, MS, CCC-SLP Speech Language Pathologist Rehab Services [phone] [facility] 07/23/2020, 8:21 AM

## 2020-07-23 NOTE — Progress Notes (Signed)
Bair Hugger placed on patient.

## 2020-07-23 NOTE — Procedures (Signed)
Intubation Procedure Note  Julie Roberts  [id number]  09/17/1945  Date:07/23/20  Time:6:15 AM   Provider Performing:Emmanual Gauthreaux Val Eagle Adaliah Hiegel    Procedure: Intubation (31500)  Indication(s) Respiratory Failure  Consent Unable to obtain consent due to emergent nature of procedure.   Anesthesia Etomidate and Rocuronium   Time Out Verified patient identification, verified procedure, site/side was marked, verified correct patient position, special equipment/implants available, medications/allergies/relevant history reviewed, required imaging and test results available.   Sterile Technique Usual hand hygeine, masks, and gloves were used   Procedure Description Patient positioned in bed supine.  Sedation given as noted above.  Patient was intubated with endotracheal tube using Glidescope.  View was Grade 1 full glottis .  Number of attempts was 1.  Colorimetric CO2 detector was consistent with tracheal placement.  7.5 ETT was used, and at 22 at the lips.    Complications/Tolerance None; patient tolerated the procedure well. Chest X-ray is ordered to verify placement.   EBL none   Specimen(s) None

## 2020-07-23 NOTE — Progress Notes (Signed)
CRITICAL CARE PROGRESS NOTE    Name: Julie Roberts MRN: [id number] DOB: May 11, 1946     LOS: 5   SUBJECTIVE FINDINGS & SIGNIFICANT EVENTS    Patient description:   74 yo F PMH as below, daughter gives history as patient is with encephalopathy.  Daughter states despite significant comorbid history she was able to drive 1 month ago and does all ADLs on her own.  She has been unsteady on feet with disequilibrium over past 2 months and she has had MRI brian for possible CVA on outpatient.  She was brought to ED due to worsening dizziness and altered mentation and found to have profound bradycardia in the teens.She had transcutaneous pacing placed. She was emergently seen by cardiology who was able to place transvenous TPM.  She was also noted to have A/CKD and hypothermia with admission to MICU for additional evaluation and management.    07/19/20- patient is improved.  I spoke with Dr Acie Fredrickson regarding cardiology plan-TPM removed from [facility]. Daughter at bedside reviewed care plan. Neurology consult for confusion.   07/20/20- patient on BIPAP.  Plan to wean precedex today and remove NIV.  Met with daughter of patient [name] and [name].    07/21/20-Patient able to answer simple question and follow commands  07/22/20-Patient with intermittent tachycardia overnight. Got precedex for agitation as well.   07-23-20-patient with significant worsening of respiratory status requiring BiPAP overnight.  Subsequently intubated.  About 2 hours after intubation patient suffered 3-minute cardiac arrest.  Patient currently sedated on the vent.  Had extended conversation with [name] the patient's daughter.  She seems understanding that overall patient has a poor prognosis and will likely not recover to the relatively limited status she had  before.  She would not want her to have a low quality of life which is almost undoubtedly what patient will have.  She is thinking about making patient DNR and will get back to [place] on that.  She seems open to the idea of making patient comfortable in the future if current care is found to be futile  Lines/tubes :   Microbiology/Sepsis markers: Results for orders placed or performed during the hospital encounter of 07/12/2020  Blood culture (routine single)     Status: None   Collection Time: 07/16/2020  3:37 PM   Specimen: BLOOD  Result Value Ref Range Status   Specimen Description BLOOD BLOOD LEFT HAND  Final   Special Requests   Final    BOTTLES DRAWN AEROBIC AND ANAEROBIC Blood Culture results may not be optimal due to an inadequate volume of blood received in culture bottles   Culture   Final    NO GROWTH 5 DAYS Performed at [facility], [address] [place], [place] [address]    Report Status 07/23/2020 FINAL  Final  Resp Panel by RT-PCR (Flu A&B, Covid) Nasopharyngeal Swab     Status: None   Collection Time: 07/22/2020  4:00 PM   Specimen: Nasopharyngeal Swab; Nasopharyngeal(NP) swabs in vial transport medium  Result Value Ref Range Status   SARS Coronavirus 2 by RT PCR NEGATIVE NEGATIVE Final    Comment: (NOTE) SARS-CoV-2 target nucleic acids are NOT DETECTED.  The SARS-CoV-2 RNA is generally detectable in upper respiratory specimens during the acute phase of infection. The lowest concentration of SARS-CoV-2 viral copies this assay can detect is 138 copies/mL. A negative result does not preclude SARS-Cov-2 infection and should not be used as the sole basis for treatment or other patient management decisions.  A negative result may occur with  improper specimen collection/handling, submission of specimen other than nasopharyngeal swab, presence of viral mutation(s) within the areas targeted by this assay, and inadequate number of viral copies(<138 copies/mL). A  negative result must be combined with clinical observations, patient history, and epidemiological information. The expected result is Negative.  Fact Sheet for Patients:  EntrepreneurPulse.com.au  Fact Sheet for Healthcare Providers:  IncredibleEmployment.be  This test is no t yet approved or cleared by the [place] FDA and  has been authorized for detection and/or diagnosis of SARS-CoV-2 by FDA under an Emergency Use Authorization (EUA). This EUA will remain  in effect (meaning this test can be used) for the duration of the COVID-19 declaration under Section 564(b)(1) of the Act, 21 U.S.C.section 360bbb-3(b)(1), unless the authorization is terminated  or revoked sooner.       Influenza A by PCR NEGATIVE NEGATIVE Final   Influenza B by PCR NEGATIVE NEGATIVE Final    Comment: (NOTE) The Xpert Xpress SARS-CoV-2/FLU/RSV plus assay is intended as an aid in the diagnosis of influenza from Nasopharyngeal swab specimens and should not be used as a sole basis for treatment. Nasal washings and aspirates are unacceptable for Xpert Xpress SARS-CoV-2/FLU/RSV testing.  Fact Sheet for Patients: EntrepreneurPulse.com.au  Fact Sheet for Healthcare Providers: IncredibleEmployment.be  This test is not yet approved or cleared by the [place] FDA and has been authorized for detection and/or diagnosis of SARS-CoV-2 by FDA under an Emergency Use Authorization (EUA). This EUA will remain in effect (meaning this test can be used) for the duration of the COVID-19 declaration under Section 564(b)(1) of the Act, 21 U.S.C. section 360bbb-3(b)(1), unless the authorization is terminated or revoked.  Performed at [facility], [address] [place], [place] [address]   MRSA PCR Screening     Status: None   Collection Time: 07/08/2020  5:47 PM   Specimen: Nasopharyngeal  Result Value Ref Range Status   MRSA by  PCR NEGATIVE NEGATIVE Final    Comment:        The GeneXpert MRSA Assay (FDA approved for NASAL specimens only), is one component of a comprehensive MRSA colonization surveillance program. It is not intended to diagnose MRSA infection nor to guide or monitor treatment for MRSA infections. Performed at [facility], [address], [place], [place] [address]   Urine culture     Status: Abnormal   Collection Time: 07/19/20  4:10 AM   Specimen: In/Out Cath Urine  Result Value Ref Range Status   Specimen Description   Final    IN/OUT CATH URINE Performed at [facility], [address] [place], [place] [address]    Special Requests   Final    NONE Performed at [facility], [address] [place], [place] [address]    Culture 60,000 COLONIES/mL ENTEROBACTER AEROGENES (A)  Final   Report Status 07/21/2020 FINAL  Final   Organism ID, Bacteria ENTEROBACTER AEROGENES (A)  Final      Susceptibility   Enterobacter aerogenes - MIC*    CEFAZOLIN RESISTANT Resistant     CEFEPIME <=0.12 SENSITIVE Sensitive     CEFTRIAXONE <=0.25 SENSITIVE Sensitive     CIPROFLOXACIN <=0.25 SENSITIVE Sensitive     GENTAMICIN <=1 SENSITIVE Sensitive     IMIPENEM <=0.25 SENSITIVE Sensitive     NITROFURANTOIN 32 SENSITIVE Sensitive     TRIMETH/SULFA <=20 SENSITIVE Sensitive     PIP/TAZO <=4 SENSITIVE Sensitive     * 60,000 COLONIES/mL  ENTEROBACTER AEROGENES  CULTURE, BLOOD (ROUTINE X 2) w Reflex to ID Panel     Status: None (Preliminary result)   Collection Time: 07/22/20  8:54 PM   Specimen: BLOOD  Result Value Ref Range Status   Specimen Description BLOOD RIGHT ARM  Final   Special Requests   Final    BOTTLES DRAWN AEROBIC AND ANAEROBIC Blood Culture adequate volume   Culture   Final    NO GROWTH < 12 HOURS Performed at [facility], [address] [place], [place] [address]    Report Status PENDING  Incomplete  CULTURE, BLOOD (ROUTINE X 2) w Reflex to ID  Panel     Status: None (Preliminary result)   Collection Time: 07/22/20  9:03 PM   Specimen: BLOOD  Result Value Ref Range Status   Specimen Description BLOOD LEFT HAND  Final   Special Requests   Final    BOTTLES DRAWN AEROBIC AND ANAEROBIC Blood Culture results may not be optimal due to an inadequate volume of blood received in culture bottles   Culture   Final    NO GROWTH < 12 HOURS Performed at [facility], [address], [place], [place] [address]    Report Status PENDING  Incomplete  Resp Panel by RT-PCR (Flu A&B, Covid) Nasopharyngeal Swab     Status: None   Collection Time: 07/22/20  9:25 PM   Specimen: Nasopharyngeal Swab; Nasopharyngeal(NP) swabs in vial transport medium  Result Value Ref Range Status   SARS Coronavirus 2 by RT PCR NEGATIVE NEGATIVE Final    Comment: (NOTE) SARS-CoV-2 target nucleic acids are NOT DETECTED.  The SARS-CoV-2 RNA is generally detectable in upper respiratory specimens during the acute phase of infection. The lowest concentration of SARS-CoV-2 viral copies this assay can detect is 138 copies/mL. A negative result does not preclude SARS-Cov-2 infection and should not be used as the sole basis for treatment or other patient management decisions. A negative result may occur with  improper specimen collection/handling, submission of specimen other than nasopharyngeal swab, presence of viral mutation(s) within the areas targeted by this assay, and inadequate number of viral copies(<138 copies/mL). A negative result must be combined with clinical observations, patient history, and epidemiological information. The expected result is Negative.  Fact Sheet for Patients:  EntrepreneurPulse.com.au  Fact Sheet for Healthcare Providers:  IncredibleEmployment.be  This test is no t yet approved or cleared by the [place] FDA and  has been authorized for detection and/or diagnosis of SARS-CoV-2 by FDA  under an Emergency Use Authorization (EUA). This EUA will remain  in effect (meaning this test can be used) for the duration of the COVID-19 declaration under Section 564(b)(1) of the Act, 21 U.S.C.section 360bbb-3(b)(1), unless the authorization is terminated  or revoked sooner.       Influenza A by PCR NEGATIVE NEGATIVE Final   Influenza B by PCR NEGATIVE NEGATIVE Final    Comment: (NOTE) The Xpert Xpress SARS-CoV-2/FLU/RSV plus assay is intended as an aid in the diagnosis of influenza from Nasopharyngeal swab specimens and should not be used as a sole basis for treatment. Nasal washings and aspirates are unacceptable for Xpert Xpress SARS-CoV-2/FLU/RSV testing.  Fact Sheet for Patients: EntrepreneurPulse.com.au  Fact Sheet for Healthcare Providers: IncredibleEmployment.be  This test is not yet approved or cleared by the [place] FDA and has been authorized for detection and/or diagnosis of SARS-CoV-2 by FDA under an Emergency Use Authorization (EUA). This EUA will remain in effect (meaning this test can  be used) for the duration of the COVID-19 declaration under Section 564(b)(1) of the Act, 21 U.S.C. section 360bbb-3(b)(1), unless the authorization is terminated or revoked.  Performed at [facility], [address] [place], [place] [address]   Culture, respiratory (non-expectorated)     Status: None (Preliminary result)   Collection Time: 07/23/20  9:07 AM   Specimen: Tracheal Aspirate; Respiratory  Result Value Ref Range Status   Specimen Description   Final    TRACHEAL ASPIRATE Performed at [facility], [address], [place], [place] [address]    Special Requests   Final    NONE Performed at [facility], [address] [place], [place] [address]    Gram Stain   Final    RARE WBC PRESENT,BOTH PMN AND MONONUCLEAR NO ORGANISMS SEEN Performed at [facility], [address]  [place],  [address]    Culture PENDING  Incomplete   Report Status PENDING  Incomplete    Anti-infectives:  Anti-infectives (From admission, onward)   Start     Dose/Rate Route Frequency Ordered Stop   04-Aug-2020 2200  vancomycin (VANCOCIN) IVPB 1000 mg/200 mL premix  Status:  Discontinued        1,000 mg 200 mL/hr over 60 Minutes Intravenous Every 48 hours 07/22/20 2126 07/22/20 2134   08-04-20 0800  ceFEPIme (MAXIPIME) 2 g in sodium chloride 0.9 % 100 mL IVPB        2 g 200 mL/hr over 30 Minutes Intravenous Every 24 hours 07/23/20 1303     07/23/20 2200  vancomycin (VANCOREADY) IVPB 750 mg/150 mL  Status:  Discontinued        750 mg 150 mL/hr over 60 Minutes Intravenous Every 24 hours 07/22/20 2135 07/23/20 1030   07/22/20 2215  vancomycin (VANCOREADY) IVPB 1750 mg/350 mL        1,750 mg 175 mL/hr over 120 Minutes Intravenous  Once 07/22/20 2115 07/23/20 0302   07/22/20 2200  ceFEPIme (MAXIPIME) 1 g in sodium chloride 0.9 % 100 mL IVPB  Status:  Discontinued        1 g 200 mL/hr over 30 Minutes Intravenous Every 8 hours 07/22/20 2110 07/23/20 1303   07/21/20 1800  ciprofloxacin (CIPRO) IVPB 400 mg  Status:  Discontinued        400 mg 200 mL/hr over 60 Minutes Intravenous Every 24 hours 07/21/20 1500 07/23/20 1029   07/20/20 2215  meropenem (MERREM) 500 mg in sodium chloride 0.9 % 100 mL IVPB  Status:  Discontinued        500 mg 200 mL/hr over 30 Minutes Intravenous Every 12 hours 07/20/20 2120 07/21/20 1457   07/20/20 2200  ceFEPIme (MAXIPIME) 2 g in sodium chloride 0.9 % 100 mL IVPB  Status:  Discontinued        2 g 200 mL/hr over 30 Minutes Intravenous Every 24 hours 07/20/20 2105 07/20/20 2118   07/19/20 0500  doxycycline (VIBRAMYCIN) 100 mg in sodium chloride 0.9 % 250 mL IVPB  Status:  Discontinued        100 mg 125 mL/hr over 120 Minutes Intravenous Every 12 hours 07/19/20 0357 07/20/20 1112       Consults: Treatment Team:  Murlean Iba, MD Leotis Pain, MD       PAST MEDICAL HISTORY   Past Medical History:  Diagnosis Date  . Asthma   . Hypertension      SURGICAL HISTORY   Past Surgical History:  Procedure Laterality Date  .  BREAST BIOPSY Left 2013   neg  . TEMPORARY PACEMAKER N/A 07/05/2020   Procedure: TEMPORARY PACEMAKER;  Surgeon: Wellington Hampshire, MD;  Location: [facility];  Service: Cardiovascular;  Laterality: N/A;     FAMILY HISTORY   Family History  Problem Relation Age of Onset  . Breast cancer Neg Hx      SOCIAL HISTORY       MEDICATIONS   Current Medication:  Current Facility-Administered Medications:  .  0.9 %  sodium chloride infusion, , Intravenous, PRN, Rust-Chester, Huel Cote, NP, Stopped at 07/21/20 1708 .  [START ON 08/19/20] ceFEPIme (MAXIPIME) 2 g in sodium chloride 0.9 % 100 mL IVPB, 2 g, Intravenous, Q24H, Zeigler, Sandi Mealy, RPH .  Chlorhexidine Gluconate Cloth 2 % PADS 6 each, 6 each, Topical, Q0600, Ottie Glazier, MD, 6 each at 07/23/20 0600 .  dexmedetomidine (PRECEDEX) 400 MCG/100ML (4 mcg/mL) infusion, 0.4-1.2 mcg/kg/hr, Intravenous, Titrated, Rust-Chester, Huel Cote, NP, Stopped at 07/22/20 2001 .  dextrose 10 % infusion, , Intravenous, Continuous, Aleskerov, Fuad, MD, Last Rate: 30 mL/hr at 07/23/20 1326, New Bag at 07/23/20 1326 .  docusate (COLACE) 50 MG/5ML liquid 100 mg, 100 mg, Per Tube, BID, Akingbade, Jimi O, MD .  fentaNYL (SUBLIMAZE) bolus via infusion 25 mcg, 25 mcg, Intravenous, Q15 min PRN, Akingbade, Jimi O, MD .  fentaNYL (SUBLIMAZE) injection 25 mcg, 25 mcg, Intravenous, Q15 min PRN, Akingbade, Jimi O, MD .  fentaNYL (SUBLIMAZE) injection 25-100 mcg, 25-100 mcg, Intravenous, Q30 min PRN, Akingbade, Jimi O, MD .  fentaNYL 2571mg in NS 2559m(1030mml) infusion-PREMIX, 25-200 mcg/hr, Intravenous, Continuous, Akingbade, Jimi O, MD, Last Rate: 5 mL/hr at 07/23/20 0500, 50 mcg/hr at 07/23/20 0500 .  haloperidol lactate (HALDOL) injection 2 mg, 2 mg, Intravenous,  Q6H PRN, IzqTyna JakschD .  heparin sodium (porcine) injection 1,000-6,000 Units, 1,000-6,000 Units, Intravenous, PRN, BlaAwilda BillP .  midazolam (VERSED) 50 mg/50 mL (1 mg/mL) premix infusion, 0.5-10 mg/hr, Intravenous, Continuous, Akingbade, Jimi O, MD, Last Rate: 2 mL/hr at 07/23/20 1013, 2 mg/hr at 07/23/20 1013 .  midazolam (VERSED) injection 1 mg, 1 mg, Intravenous, Q15 min PRN, Akingbade, Jimi O, MD .  midazolam (VERSED) injection 1 mg, 1 mg, Intravenous, Q2H PRN, Akingbade, Jimi O, MD .  morphine 2 MG/ML injection 0.5-1 mg, 0.5-1 mg, Intravenous, Q4H PRN, KeeDarel Hong NP, 1 mg at 07/22/20 1111 .  norepinephrine (LEVOPHED) 16 mg in 250m49memix infusion, 0-40 mcg/min, Intravenous, Titrated, Akingbade, Jimi O, MD, Last Rate: 32.8 mL/hr at 07/23/20 1051, 35 mcg/min at 07/23/20 1051 .  ondansetron (ZOFRAN) injection 4 mg, 4 mg, Intravenous, Q6H PRN, Rust-Chester, Britton L, NP .  phenylephrine CONCENTRATED 100mg34msodium chloride 0.9% 250mL 18mmg/mL36mnfusion, 0-400 mcg/min, Intravenous, Titrated, AkingbaCheryll Desserteld at 07/23/20 0108 .  polyethylene glycol (MIRALAX / GLYCOLAX) packet 17 g, 17 g, Per Tube, Daily, Akingbade, Jimi O, MD .  sodium chloride flush (NS) 0.9 % injection 10-40 mL, 10-40 mL, Intracatheter, Q12H, AleskerOttie Glazier0 mL at 07/23/20 0954 .  sodium chloride flush (NS) 0.9 % injection 10-40 mL, 10-40 mL, Intracatheter, PRN, AleskerLanney Gins MD .  vasopressin (PITRESSIN) 20 Units in sodium chloride 0.9 % 100 mL infusion-*FOR SHOCK*, 0-0.03 Units/min, Intravenous, Continuous, Akingbade, Jimi O, MD, Last Rate: 9 mL/hr at 07/23/20 1328, 0.03 Units/min at 07/23/20 1328    ALLERGIES   Penicillins and Ace inhibitors    REVIEW OF SYSTEMS    Unable to obtain due  to encephalopathy  PHYSICAL EXAMINATION   Vital Signs: Temp:  [86.36 F (30.2 C)-98 F (36.7 C)] 96.62 F (35.9 C) (12/29 1000) Pulse Rate:  [31-210] 64 (12/29 1000) Resp:   [11-67] 22 (12/29 1000) BP: (56-172)/(31-85) 115/52 (12/29 1000) SpO2:  [82 %-100 %] 98 % (12/29 1000) FiO2 (%):  [80 %-100 %] 100 % (12/29 0310)  GENERAL:age appropriate HEAD: Normocephalic, atraumatic.  EYES: Pupils equal, round, reactive to light.  No scleral icterus.  MOUTH: Moist mucosal membrane. NECK: Supple. No thyromegaly. No nodules. No JVD.  PULMONARY: decreased air entry bilaterally  CARDIOVASCULAR: S1 and S2. Regular rate and rhythm. No murmurs, rubs, or gallops.  GASTROINTESTINAL: Soft, nontender, non-distended. No masses. Positive bowel sounds. No hepatosplenomegaly.  MUSCULOSKELETAL: No swelling, clubbing, or edema.  NEUROLOGIC: GCS10 SKIN:intact,warm,dry   PERTINENT DATA     Infusions: . sodium chloride Stopped (07/21/20 1708)  . [START ON 11-Aug-2020] ceFEPime (MAXIPIME) IV    . dexmedetomidine (PRECEDEX) IV infusion Stopped (07/22/20 2001)  . dextrose 30 mL/hr at 07/23/20 1326  . fentaNYL infusion INTRAVENOUS 50 mcg/hr (07/23/20 0500)  . midazolam 2 mg/hr (07/23/20 1013)  . norepinephrine (LEVOPHED) Adult infusion 35 mcg/min (07/23/20 1051)  . phenylephrine (NEO-SYNEPHRINE) Adult infusion Stopped (07/23/20 0108)  . vasopressin 0.03 Units/min (07/23/20 1328)   Scheduled Medications: . Chlorhexidine Gluconate Cloth  6 each Topical Q0600  . docusate  100 mg Per Tube BID  . polyethylene glycol  17 g Per Tube Daily  . sodium chloride flush  10-40 mL Intracatheter Q12H   PRN Medications:  Hemodynamic parameters:   Intake/Output: 12/28 0701 - 12/29 0700 In: 2144.1 [I.V.:1494.1; IV Piggyback:650] Out: 400 [Urine:400]  Ventilator  Settings: Vent Mode: Other (Comment) FiO2 (%):  [80 %-100 %] 100 % Set Rate:  [18 bmp-22 bmp] 22 bmp Vt Set:  [320 mL-340 mL] 340 mL PEEP:  [14 cmH20] 14 cmH20 Plateau Pressure:  [18 cmH20-26 cmH20] 26 cmH20   LAB RESULTS:  Basic Metabolic Panel: Recent Labs  Lab 07/19/20 0426 07/19/20 1405 07/19/20 1527 07/20/20 0416  07/21/20 0609 07/22/20 0555 07/22/20 2042 07/23/20 0425  NA 132* 135  --  141 141 145 145 142  K 4.4 6.3*   < > 5.0 4.7 4.4 4.1 4.1  CL 102 103  --  106 108 111 111 109  CO2 19* 19*  --  _0 GLUCOSE 118* 119*  --  98 87 110* 108* 139*  BUN 57* 58*  --  54* 46* 40* 38* 39*  CREATININE 3.74* 3.75*  --  3.17* 2.72* 2.61* 2.22* 2.32*  CALCIUM 8.9 9.2  --  8.8* 8.9 9.0 9.0 9.6  MG 1.9  --   --  1.8 2.3 2.0  --  2.0  PHOS 1.8* 4.8*  --  4.3 3.7  --   --  6.0*   < > = values in this interval not displayed.   Liver Function Tests: Recent Labs  Lab 07/14/2020 1600 07/19/20 0426 07/20/20 0416 07/21/20 0609 07/22/20 2042 07/23/20 0425  AST 61*  --   --   --  47*  --   ALT 56*  --   --   --  37  --   ALKPHOS 152*  --   --   --  129*  --   BILITOT 0.5  --   --   --  1.1  --   PROT 6.2*  --   --   --  6.1*  --  ALBUMIN 3.1* 2.4* 2.4* 2.2* 2.3* 2.3*   No results for input(s): LIPASE, AMYLASE in the last 168 hours. Recent Labs  Lab 07/19/20 2025  AMMONIA 16   CBC: Recent Labs  Lab 07/12/2020 1600 07/19/20 0426 07/20/20 0416 07/21/20 0609 07/22/20 0555 07/22/20 2042  WBC 11.4* 7.3 8.7 10.0 10.9* 12.2*  NEUTROABS 8.5* 6.5  --   --   --  10.0*  HGB 8.6* 8.8* 8.7* 7.8* 8.1* 8.1*  HCT 25.3* 26.6* 24.2* 22.9* 23.3* 24.7*  MCV 92.3 93.7 88.0 92.0 91.7 94.6  PLT 142* 152 101* 80* 82* 91*   Cardiac Enzymes: No results for input(s): CKTOTAL, CKMB, CKMBINDEX, TROPONINI in the last 168 hours. BNP: Invalid input(s): POCBNP CBG: Recent Labs  Lab 07/22/20 1952 07/23/20 0024 07/23/20 0444 07/23/20 0722 07/23/20 1124  GLUCAP 117* 141* 125* 97 97       IMAGING RESULTS:  Imaging: DG Chest 1 View  Result Date: 07/22/2020 CLINICAL DATA:  Intubation. EXAM: CHEST  1 VIEW COMPARISON:  Radiograph earlier today. FINDINGS: Endotracheal tube tip 2.9 cm from the carina. Right internal jugular dialysis catheter tip at the atrial caval junction, unchanged. No pneumothorax.  Stable heart size and mediastinal contours. No significant change in the diffuse bilateral lung opacities from earlier today. Slight perihilar predominance. No large pleural effusion. Remote left clavicle fracture. IMPRESSION: 1. Endotracheal tube tip 2.9 cm from the carina. 2. Unchanged diffuse bilateral lung opacities from earlier today, edema versus multifocal pneumonia. Electronically Signed   By: Keith Rake M.D.   On: 07/22/2020 21:53   DG Chest Port 1 View  Result Date: 07/22/2020 CLINICAL DATA:  Follow-up shortness of breath. EXAM: PORTABLE CHEST 1 VIEW COMPARISON:  07/21/2020 and multiple previous FINDINGS: Artifact overlies the chest. Right internal jugular central line tip again at the SVC RA junction. Persistence and probable slight worsening of diffuse airspace lung density. Findings could be due to edema, ARDS or pneumonia. No new qualitative finding. No effusion. Bony structures unremarkable. IMPRESSION: Persistent and probable slight worsening of diffuse airspace density. Findings could be due to edema, ARDS or pneumonia. Electronically Signed   By: Nelson Chimes M.D.   On: 07/22/2020 20:37   _0 @ DG Chest 1 View  Result Date: 07/22/2020 CLINICAL DATA:  Intubation. EXAM: CHEST  1 VIEW COMPARISON:  Radiograph earlier today. FINDINGS: Endotracheal tube tip 2.9 cm from the carina. Right internal jugular dialysis catheter tip at the atrial caval junction, unchanged. No pneumothorax. Stable heart size and mediastinal contours. No significant change in the diffuse bilateral lung opacities from earlier today. Slight perihilar predominance. No large pleural effusion. Remote left clavicle fracture. IMPRESSION: 1. Endotracheal tube tip 2.9 cm from the carina. 2. Unchanged diffuse bilateral lung opacities from earlier today, edema versus multifocal pneumonia. Electronically Signed   By: Keith Rake M.D.   On: 07/22/2020 21:53   DG Chest Port 1 View  Result Date:  07/22/2020 CLINICAL DATA:  Follow-up shortness of breath. EXAM: PORTABLE CHEST 1 VIEW COMPARISON:  07/21/2020 and multiple previous FINDINGS: Artifact overlies the chest. Right internal jugular central line tip again at the SVC RA junction. Persistence and probable slight worsening of diffuse airspace lung density. Findings could be due to edema, ARDS or pneumonia. No new qualitative finding. No effusion. Bony structures unremarkable. IMPRESSION: Persistent and probable slight worsening of diffuse airspace density. Findings could be due to edema, ARDS or pneumonia. Electronically Signed   By: Nelson Chimes M.D.   On: 07/22/2020 20:37  ASSESSMENT AND PLAN    -Multidisciplinary rounds held today  Acute hypoxic respiratory failure-likely secondary to aspiration -Follow-up sputum cultures -Continue broad-spectrum antibiotics as ordered -Palliative care consult -Maintain tidal volumes of 6 cc/kg -Repeat VBG this afternoon as patient was significantly acidotic overnight and mostly respiratory   Cardiac arrest-likely secondary to acidosis -A large component of this was respiratory and mixed with metabolic. -This was reportedly PEA arrest no need for cooling at this time will maintain temperature is normothermic with Tylenol: Blankets as needed   Acute on chronic renal failure  - d/c nonessential nephrotoxins ICU monitoring -follow chem 7 -follow UO -continue Foley Catheter-assess need daily  Altered mental status with encephalopathy -Patient with waxing and waning mental status  -We will obtain CT head -Additionally will obtain B12, folate, thiamine, RPR, and studies for lupus as patient was having significant gait instability prior to her altered mentation. -Findings on head CT may help family understand the prognosis is poor  Symptomatic bradyarrythmia   Cardiology on case - Dr Fletcher Anon - appreciate input - likely due to CKD with reaccumulation of BB -please follow cardiology  recommendations -Has improved since admission.  Sinus tachycardia/ectopic atrial tachycardia-in 120's  Likely from low volume status  1 liter bolus  ID/UIT meropenem switched to cipro for enterobacter in the urine.   GI/Nutrition GI PROPHYLAXIS as indicated DIET-->TF's as tolerated Constipation protocol as indicated  ENDO - ICU hypoglycemic\Hyperglycemia protocol -check FSBS per protocol -Patient will need speech eval on 07-22-20   ELECTROLYTES -follow labs as needed -replace as needed -pharmacy consultation   DVT/GI PRX ordered -SCDs  TRANSFUSIONS AS NEEDED MONITOR FSBS ASSESS the need for LABS as needed   Critical care provider statement:    Critical care time (minutes):  35   Critical care time was exclusive of:  Separately billable procedures and treating other patients   Critical care was necessary to treat or prevent imminent or life-threatening deterioration of the following conditions:  altered mental status, encephalopathy, symptomatic bradyarrythmia, Acute on chronic renal failure, multiple comorbid conditions.   Critical care was time spent personally by me on the following activities:  Development of treatment plan with patient or surrogate, discussions with consultants, evaluation of patient's response to treatment, examination of patient, obtaining history from patient or surrogate, ordering and performing treatments and interventions, ordering and review of laboratory studies and re-evaluation of patient's condition.  I assumed direction of critical care for this patient from another provider in my specialty: no    This document was prepared using Dragon voice recognition software and may include unintentional dictation errors.   Newell Coral DO Internal Medicine/Pediatrics Pulmonary and Critical Care Fellow PGY-7

## 2020-07-23 NOTE — Progress Notes (Signed)
Nutrition Follow-up  DOCUMENTATION CODES:   Not applicable  INTERVENTION:  Once patient is stable enough for tube feeds and enteral access is obtained recommend initiating tube feeds: -Initiate Vital AF 1.2 Cal at 20 mL/hr and advance by 15 mL/hr every 12 hours to goal rate of 50 mL/hr (1200 mL goal daily volume) -Provide PROSource TF 45 mL BID -Goal regimen provides 1520 kcal, 112 grams of protein, 972 mL H2O daily.  Monitor magnesium, potassium, and phosphorus daily for at least 3 days, MD to replete as needed, as pt is at risk for refeeding syndrome.  NUTRITION DIAGNOSIS:   Inadequate oral intake related to inability to eat as evidenced by NPO status.  Ongoing.  GOAL:   Patient will meet greater than or equal to 90% of their needs  Not met at this time.  MONITOR:   Labs,Weight trends,TF tolerance,I & O's  REASON FOR ASSESSMENT:   Rounds,Ventilator    ASSESSMENT:   74 year old female with asthma, HTN admitted with AMS/encephalopathy, symptomatic bradyarrhythmia, acute on chronic renal failure.  NGT was not placed on 12/27 or 12/28 as patient had started to become more alert and hope was that diet could be advanced by SLP. Overnight patient had worsening of respiratory status overnight requiring BiPAP and later was intubated.  Patient is currently intubated on ventilator support MV: 7.2 L/min Temp (24hrs), Avg:94.5 F (34.7 C), Min:86.36 F (30.2 C), Max:100.22 F (37.9 C)  Medications reviewed and include: Colace 100 mg BID, Miralax, cefepime, D10 at 30 mL/hr, fentanyl gtt, Versed gtt, norepinephrine gtt at 35 mcg/min, vasopressin gtt 0.03 units/min.  Labs reviewed: CBG 97-125, BUN 39, Creatinine 2.32, Phosphorus 6.  I/O: 400 mL UOP yesterday  No weight to trend since 12/26.  Patient with no enteral access at this time as it was unable to be obtained last night. Per RN plan is to attempt OGT placement today.  Discussed with RN and on rounds.  Diet Order:    Diet Order    None     EDUCATION NEEDS:   No education needs have been identified at this time  Skin:  Skin Assessment: Reviewed RN Assessment  Last BM:  07/23/2020 - type 6  Height:   Ht Readings from Last 1 Encounters:  06/28/2020 '5\' 3"'  (1.6 m)   Weight:   Wt Readings from Last 1 Encounters:  07/20/20 76.3 kg   BMI:  Body mass index is 29.8 kg/m.  Estimated Nutritional Needs:   Kcal:  1528  Protein:  100-115 grams  Fluid:  1.7-1.9 L/day  Jacklynn Barnacle, MS, RD, LDN Pager number available on Amion

## 2020-07-23 NOTE — Procedures (Signed)
Arterial Catheter Insertion Procedure Note  Julie Roberts  [id number]  1946/01/16  Date:07/23/20  Time:6:18 AM    Provider Performing: Salome Holmes    Procedure: Insertion of Arterial Line (09323) with US guidance (55732)   Indication(s) Blood pressure monitoring and/or need for frequent ABGs  Consent Unable to obtain consent due to emergent nature of procedure.  Anesthesia None   Time Out Verified patient identification, verified procedure, site/side was marked, verified correct patient position, special equipment/implants available, medications/allergies/relevant history reviewed, required imaging and test results available.   Sterile Technique Maximal sterile technique including full sterile barrier drape, hand hygiene, sterile gown, sterile gloves, mask, hair covering, sterile ultrasound probe cover (if used).   Procedure Description Area of catheter insertion was cleaned with chlorhexidine and draped in sterile fashion. With real-time ultrasound guidance an arterial catheter was attempted to be placed into the right radial and also attempted in right femoral artery -- both unsuccessful.  There were 3 attempts in the right radial and 2 attempts in the right femoral.    Complications/Tolerance No immediate complications, however attempts in the right radial and femoral both unsuccessful.    EBL Minimal   Specimen(s) None

## 2020-07-23 NOTE — Progress Notes (Signed)
Pt was transported to CT from CCU and back while on the vent. 

## 2020-07-23 NOTE — Progress Notes (Signed)
  Code Blue Note The patient had been intubated earlier at 21:25 on 07/22/20 without issues for obtundation, increased WOB and severe hypoxemia PO2 of 56 on 100% BiPAP.    Suddenly went into asystolic arrest at 23:33.  The patient received 1 round of Epi, 2 amps of Bicarb, calcium chloride 1 gm.  She achieved ROSC after approximately 4 minutes of coding.    Addendum Event Later around 1 am, the patient became bradycardic into the 20s but did not loose pulse.  She began recovering her pulse rate into the high 40s by the time she was given Atropine 0.5mg  with good response.   The patient's daughter was updated and notified about these events during 2 separate phone conversations during the night.  She was encouraged to come bedside this morning for more discussion about condition.    Critical Care time:  35 minutes  ___________________ Drue Stager. Coralie Common, MD [facility]

## 2020-07-23 NOTE — Progress Notes (Signed)
Patient ID: Julie Roberts, female   DOB: 1945/09/07, 74 y.o.   MRN: [id number]   Thank you for consulting the Palliative Medicine Team at [facility] to meet your patient's and family's needs.   We have scheduled a family meeting for  Tomorrow 08-06-2020  at 10:00 am  with daughter at bedside   No charge  Lorinda Creed NP  Palliative Medicine Team Team Phone # [phone] Pager [phone]

## 2020-07-23 NOTE — Progress Notes (Signed)
This RN in patient room when patient became profoundly bradycardic in 20s-30s with a long pause. Patient did not lose pulse. Dr. Coralie Common immediately to bedside, received orders for 0.5mg  atropine x 1 dose.

## 2020-07-23 NOTE — Progress Notes (Signed)
[facility], [place] 07/23/20  Subjective:   LOS: 5 12/28 0701 - 12/29 0700 In: 2144.1 [I.V.:1494.1; IV Piggyback:650] Out: 400 [Urine:400]  Patient with significant deterioration of her condition yesterday. Originally had to be intubated. Then suffered asystolic arrest. Urine output down to 400 cc over the preceding 24 hours.  Objective:  Vital signs in last 24 hours:  Temp:  [86.36 F (30.2 C)-99.32 F (37.4 C)] 91.94 F (33.3 C) (12/29 0645) Pulse Rate:  [31-210] 53 (12/29 0645) Resp:  [11-67] 22 (12/29 0645) BP: (56-172)/(31-85) 121/52 (12/29 0645) SpO2:  [80 %-100 %] 100 % (12/29 0645) FiO2 (%):  [80 %-100 %] 100 % (12/29 0310)  Weight change:  Filed Weights   Aug 07, 2020 1620 07/20/20 0500  Weight: 72.6 kg 76.3 kg    Intake/Output:    Intake/Output Summary (Last 24 hours) at 07/23/2020 0843 Last data filed at 07/23/2020 0500 Gross per 24 hour  Intake 2144.09 ml  Output 400 ml  Net 1744.09 ml     Physical Exam: General: critically ill appearing  HEENT endotracheal tube in place  Pulm/lungs bilateral rales and rhonchi, vent assisted  CVS/Heart regular  Abdomen:  soft  Extremities: Trace edema  Neurologic: Intubated  Skin: No acute rashes  Access: Rt IJ temp cath  GU: Foley in place    Basic Metabolic Panel:  Recent Labs  Lab 07/19/20 0426 07/19/20 1405 07/19/20 1527 07/20/20 0416 07/21/20 0609 07/22/20 0555 07/22/20 2042 07/23/20 0425  NA 132* 135  --  141 141 145 145 142  K 4.4 6.3*   < > 5.0 4.7 4.4 4.1 4.1  CL 102 103  --  106 108 111 111 109  CO2 19* 19*  --  26 25 25 24 23   GLUCOSE 118* 119*  --  98 87 110* 108* 139*  BUN 57* 58*  --  54* 46* 40* 38* 39*  CREATININE 3.74* 3.75*  --  3.17* 2.72* 2.61* 2.22* 2.32*  CALCIUM 8.9 9.2  --  8.8* 8.9 9.0 9.0 9.6  MG 1.9  --   --  1.8 2.3 2.0  --  2.0  PHOS 1.8* 4.8*  --  4.3 3.7  --   --  6.0*   < > = values in this interval not displayed.     CBC: Recent  Labs  Lab 08/07/20 1600 07/19/20 0426 07/20/20 0416 07/21/20 0609 07/22/20 0555 07/22/20 2042  WBC 11.4* 7.3 8.7 10.0 10.9* 12.2*  NEUTROABS 8.5* 6.5  --   --   --  10.0*  HGB 8.6* 8.8* 8.7* 7.8* 8.1* 8.1*  HCT 25.3* 26.6* 24.2* 22.9* 23.3* 24.7*  MCV 92.3 93.7 88.0 92.0 91.7 94.6  PLT 142* 152 101* 80* 82* 91*     No results found for: HEPBSAG, HEPBSAB, HEPBIGM    Microbiology:  Recent Results (from the past 240 hour(s))  Blood culture (routine single)     Status: None   Collection Time: 08-07-2020  3:37 PM   Specimen: BLOOD  Result Value Ref Range Status   Specimen Description BLOOD BLOOD LEFT HAND  Final   Special Requests   Final    BOTTLES DRAWN AEROBIC AND ANAEROBIC Blood Culture results may not be optimal due to an inadequate volume of blood received in culture bottles   Culture   Final    NO GROWTH 5 DAYS Performed at [facility], [address], [place], [place]    Report Status 07/23/2020 FINAL  Final  Resp  Panel by RT-PCR (Flu A&B, Covid) Nasopharyngeal Swab     Status: None   Collection Time: 07/20/2020  4:00 PM   Specimen: Nasopharyngeal Swab; Nasopharyngeal(NP) swabs in vial transport medium  Result Value Ref Range Status   SARS Coronavirus 2 by RT PCR NEGATIVE NEGATIVE Final    Comment: (NOTE) SARS-CoV-2 target nucleic acids are NOT DETECTED.  The SARS-CoV-2 RNA is generally detectable in upper respiratory specimens during the acute phase of infection. The lowest concentration of SARS-CoV-2 viral copies this assay can detect is 138 copies/mL. A negative result does not preclude SARS-Cov-2 infection and should not be used as the sole basis for treatment or other patient management decisions. A negative result may occur with  improper specimen collection/handling, submission of specimen other than nasopharyngeal swab, presence of viral mutation(s) within the areas targeted by this assay, and inadequate number of viral copies(<138  copies/mL). A negative result must be combined with clinical observations, patient history, and epidemiological information. The expected result is Negative.  Fact Sheet for Patients:  BloggerCourse.comhttps://www.fda.gov/media/152166/download  Fact Sheet for Healthcare Providers:  SeriousBroker.ithttps://www.fda.gov/media/152162/download  This test is no t yet approved or cleared by the [place] FDA and  has been authorized for detection and/or diagnosis of SARS-CoV-2 by FDA under an Emergency Use Authorization (EUA). This EUA will remain  in effect (meaning this test can be used) for the duration of the COVID-19 declaration under Section 564(b)(1) of the Act, 21 U.S.C.section 360bbb-3(b)(1), unless the authorization is terminated  or revoked sooner.       Influenza A by PCR NEGATIVE NEGATIVE Final   Influenza B by PCR NEGATIVE NEGATIVE Final    Comment: (NOTE) The Xpert Xpress SARS-CoV-2/FLU/RSV plus assay is intended as an aid in the diagnosis of influenza from Nasopharyngeal swab specimens and should not be used as a sole basis for treatment. Nasal washings and aspirates are unacceptable for Xpert Xpress SARS-CoV-2/FLU/RSV testing.  Fact Sheet for Patients: BloggerCourse.comhttps://www.fda.gov/media/152166/download  Fact Sheet for Healthcare Providers: SeriousBroker.ithttps://www.fda.gov/media/152162/download  This test is not yet approved or cleared by the [place] FDA and has been authorized for detection and/or diagnosis of SARS-CoV-2 by FDA under an Emergency Use Authorization (EUA). This EUA will remain in effect (meaning this test can be used) for the duration of the COVID-19 declaration under Section 564(b)(1) of the Act, 21 U.S.C. section 360bbb-3(b)(1), unless the authorization is terminated or revoked.  Performed at [facility], [address] [place], [place] [address]   MRSA PCR Screening     Status: None   Collection Time: 07/14/2020  5:47 PM   Specimen: Nasopharyngeal  Result Value Ref Range  Status   MRSA by PCR NEGATIVE NEGATIVE Final    Comment:        The GeneXpert MRSA Assay (FDA approved for NASAL specimens only), is one component of a comprehensive MRSA colonization surveillance program. It is not intended to diagnose MRSA infection nor to guide or monitor treatment for MRSA infections. Performed at [facility], [address] [place], [place] [address]   Urine culture     Status: Abnormal   Collection Time: 07/19/20  4:10 AM   Specimen: In/Out Cath Urine  Result Value Ref Range Status   Specimen Description   Final    IN/OUT CATH URINE Performed at [facility], [address] [place], [place] [address]    Special Requests   Final    NONE Performed at [facility], [address] [place], [place] [address]  Culture 60,000 COLONIES/mL ENTEROBACTER AEROGENES (A)  Final   Report Status 07/21/2020 FINAL  Final   Organism ID, Bacteria ENTEROBACTER AEROGENES (A)  Final      Susceptibility   Enterobacter aerogenes - MIC*    CEFAZOLIN RESISTANT Resistant     CEFEPIME <=0.12 SENSITIVE Sensitive     CEFTRIAXONE <=0.25 SENSITIVE Sensitive     CIPROFLOXACIN <=0.25 SENSITIVE Sensitive     GENTAMICIN <=1 SENSITIVE Sensitive     IMIPENEM <=0.25 SENSITIVE Sensitive     NITROFURANTOIN 32 SENSITIVE Sensitive     TRIMETH/SULFA <=20 SENSITIVE Sensitive     PIP/TAZO <=4 SENSITIVE Sensitive     * 60,000 COLONIES/mL ENTEROBACTER AEROGENES  CULTURE, BLOOD (ROUTINE X 2) w Reflex to ID Panel     Status: None (Preliminary result)   Collection Time: 07/22/20  8:54 PM   Specimen: BLOOD  Result Value Ref Range Status   Specimen Description BLOOD RIGHT ARM  Final   Special Requests   Final    BOTTLES DRAWN AEROBIC AND ANAEROBIC Blood Culture adequate volume   Culture   Final    NO GROWTH < 12 HOURS Performed at [facility], [address] [place], [place] [address]    Report Status PENDING  Incomplete  CULTURE, BLOOD (ROUTINE X  2) w Reflex to ID Panel     Status: None (Preliminary result)   Collection Time: 07/22/20  9:03 PM   Specimen: BLOOD  Result Value Ref Range Status   Specimen Description BLOOD LEFT HAND  Final   Special Requests   Final    BOTTLES DRAWN AEROBIC AND ANAEROBIC Blood Culture results may not be optimal due to an inadequate volume of blood received in culture bottles   Culture   Final    NO GROWTH < 12 HOURS Performed at [facility], [address] [place], [place] [address]    Report Status PENDING  Incomplete  Resp Panel by RT-PCR (Flu A&B, Covid) Nasopharyngeal Swab     Status: None   Collection Time: 07/22/20  9:25 PM   Specimen: Nasopharyngeal Swab; Nasopharyngeal(NP) swabs in vial transport medium  Result Value Ref Range Status   SARS Coronavirus 2 by RT PCR NEGATIVE NEGATIVE Final    Comment: (NOTE) SARS-CoV-2 target nucleic acids are NOT DETECTED.  The SARS-CoV-2 RNA is generally detectable in upper respiratory specimens during the acute phase of infection. The lowest concentration of SARS-CoV-2 viral copies this assay can detect is 138 copies/mL. A negative result does not preclude SARS-Cov-2 infection and should not be used as the sole basis for treatment or other patient management decisions. A negative result may occur with  improper specimen collection/handling, submission of specimen other than nasopharyngeal swab, presence of viral mutation(s) within the areas targeted by this assay, and inadequate number of viral copies(<138 copies/mL). A negative result must be combined with clinical observations, patient history, and epidemiological information. The expected result is Negative.  Fact Sheet for Patients:  BloggerCourse.com  Fact Sheet for Healthcare Providers:  SeriousBroker.it  This test is no t yet approved or cleared by the [place] FDA and  has been authorized for detection and/or diagnosis of  SARS-CoV-2 by FDA under an Emergency Use Authorization (EUA). This EUA will remain  in effect (meaning this test can be used) for the duration of the COVID-19 declaration under Section 564(b)(1) of the Act, 21 U.S.C.section 360bbb-3(b)(1), unless the authorization is terminated  or revoked sooner.       Influenza A by  PCR NEGATIVE NEGATIVE Final   Influenza B by PCR NEGATIVE NEGATIVE Final    Comment: (NOTE) The Xpert Xpress SARS-CoV-2/FLU/RSV plus assay is intended as an aid in the diagnosis of influenza from Nasopharyngeal swab specimens and should not be used as a sole basis for treatment. Nasal washings and aspirates are unacceptable for Xpert Xpress SARS-CoV-2/FLU/RSV testing.  Fact Sheet for Patients: BloggerCourse.com  Fact Sheet for Healthcare Providers: SeriousBroker.it  This test is not yet approved or cleared by the [place] FDA and has been authorized for detection and/or diagnosis of SARS-CoV-2 by FDA under an Emergency Use Authorization (EUA). This EUA will remain in effect (meaning this test can be used) for the duration of the COVID-19 declaration under Section 564(b)(1) of the Act, 21 U.S.C. section 360bbb-3(b)(1), unless the authorization is terminated or revoked.  Performed at [facility], [address] [place], [place] [address]     Coagulation Studies: No results for input(s): LABPROT, INR in the last 72 hours.  Urinalysis: No results for input(s): COLORURINE, LABSPEC, PHURINE, GLUCOSEU, HGBUR, BILIRUBINUR, KETONESUR, PROTEINUR, UROBILINOGEN, NITRITE, LEUKOCYTESUR in the last 72 hours.  Invalid input(s): APPERANCEUR    Imaging: DG Chest 1 View  Result Date: 07/22/2020 CLINICAL DATA:  Intubation. EXAM: CHEST  1 VIEW COMPARISON:  Radiograph earlier today. FINDINGS: Endotracheal tube tip 2.9 cm from the carina. Right internal jugular dialysis catheter tip at the atrial caval  junction, unchanged. No pneumothorax. Stable heart size and mediastinal contours. No significant change in the diffuse bilateral lung opacities from earlier today. Slight perihilar predominance. No large pleural effusion. Remote left clavicle fracture. IMPRESSION: 1. Endotracheal tube tip 2.9 cm from the carina. 2. Unchanged diffuse bilateral lung opacities from earlier today, edema versus multifocal pneumonia. Electronically Signed   By: Narda Rutherford M.D.   On: 07/22/2020 21:53   DG Chest Port 1 View  Result Date: 07/22/2020 CLINICAL DATA:  Follow-up shortness of breath. EXAM: PORTABLE CHEST 1 VIEW COMPARISON:  07/21/2020 and multiple previous FINDINGS: Artifact overlies the chest. Right internal jugular central line tip again at the SVC RA junction. Persistence and probable slight worsening of diffuse airspace lung density. Findings could be due to edema, ARDS or pneumonia. No new qualitative finding. No effusion. Bony structures unremarkable. IMPRESSION: Persistent and probable slight worsening of diffuse airspace density. Findings could be due to edema, ARDS or pneumonia. Electronically Signed   By: Paulina Fusi M.D.   On: 07/22/2020 20:37     Medications:   . sodium chloride Stopped (07/21/20 1708)  . ceFEPime (MAXIPIME) IV 1 g (07/23/20 0755)  . ciprofloxacin Stopped (07/22/20 1856)  . dexmedetomidine (PRECEDEX) IV infusion Stopped (07/22/20 2001)  . dextrose 30 mL/hr at 07/23/20 0500  . fentaNYL infusion INTRAVENOUS 50 mcg/hr (07/23/20 0500)  . midazolam 2 mg/hr (07/23/20 0500)  . norepinephrine (LEVOPHED) Adult infusion 35 mcg/min (07/23/20 0500)  . phenylephrine (NEO-SYNEPHRINE) Adult infusion Stopped (07/23/20 0108)  . vancomycin    . vasopressin 0.03 Units/min (07/23/20 0500)   . Chlorhexidine Gluconate Cloth  6 each Topical Q0600  . docusate  100 mg Per Tube BID  . fentaNYL (SUBLIMAZE) injection  25 mcg Intravenous Once  . polyethylene glycol  17 g Per Tube Daily  . sodium  chloride flush  10-40 mL Intracatheter Q12H   sodium chloride, fentaNYL, fentaNYL (SUBLIMAZE) injection, fentaNYL (SUBLIMAZE) injection, haloperidol lactate, heparin sodium (porcine), midazolam, midazolam, morphine injection, ondansetron (ZOFRAN) IV, sodium chloride flush  Assessment/ Plan:  74 y.o. 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  Acute kidney injury on CKD st 2. Baseline creatinine 1.0/GFR 66 from January 17, 2020 Recent Labs    07/21/20 4098 07/22/20 0555 07/22/20 2042 07/23/20 0425  CREATININE 2.72* 2.61* 2.22* 2.32*  Urinalysis December 25 shows protein 100 mg/dL, 11-91 RBCs, 47-82 WBCs Covid panel is negative  AKI likely secondary to ATN due to hemodynamic instability from bradycardia and cardiac arrest on 07/22/2020 -Urine output dropped to 400 cc over the preceding 24 hours.  Not unexpected given cardiac arrest late yesterday.  Creatinine currently 2.3.  No immediate need for dialysis however if urine output remains in the oliguric range suspect that she will need renal placement tomorrow.  #. Anemia of CKD.  Lab Results  Component Value Date   HGB 8.1 (L) 07/22/2020   Continue to monitor CBC.  No immediate need for blood transfusion.   #.  Bradycardia and hypotension Required Temporary pacemaker    # Acute resp failure/asystolic cardiac arrest 07/22/2020 Patient with cardiac arrest late yesterday.  Currently on the ventilator.  Continue ventilatory support as per pulmonary/critical care.    LOS: 5 Xaria Judon 12/29/20218:43 AM  [facility] [address] [place], [place] [phone]

## 2020-07-24 DIAGNOSIS — Z515 Encounter for palliative care: Secondary | ICD-10-CM | POA: Diagnosis not present

## 2020-07-24 DIAGNOSIS — J9600 Acute respiratory failure, unspecified whether with hypoxia or hypercapnia: Secondary | ICD-10-CM

## 2020-07-24 DIAGNOSIS — J189 Pneumonia, unspecified organism: Secondary | ICD-10-CM

## 2020-07-24 DIAGNOSIS — N179 Acute kidney failure, unspecified: Secondary | ICD-10-CM | POA: Diagnosis not present

## 2020-07-24 DIAGNOSIS — J9601 Acute respiratory failure with hypoxia: Secondary | ICD-10-CM | POA: Diagnosis not present

## 2020-07-24 DIAGNOSIS — Z66 Do not resuscitate: Secondary | ICD-10-CM

## 2020-07-24 DIAGNOSIS — J9691 Respiratory failure, unspecified with hypoxia: Secondary | ICD-10-CM | POA: Diagnosis not present

## 2020-07-24 DIAGNOSIS — R4182 Altered mental status, unspecified: Secondary | ICD-10-CM

## 2020-07-24 LAB — CBC WITH DIFFERENTIAL/PLATELET
Abs Immature Granulocytes: 0.5 10*3/uL — ABNORMAL HIGH (ref 0.00–0.07)
Basophils Absolute: 0.1 10*3/uL (ref 0.0–0.1)
Basophils Relative: 1 %
Eosinophils Absolute: 0 10*3/uL (ref 0.0–0.5)
Eosinophils Relative: 0 %
HCT: 24.4 % — ABNORMAL LOW (ref 36.0–46.0)
Hemoglobin: 7.8 g/dL — ABNORMAL LOW (ref 12.0–15.0)
Immature Granulocytes: 4 %
Lymphocytes Relative: 13 %
Lymphs Abs: 1.7 10*3/uL (ref 0.7–4.0)
MCH: 31 pg (ref 26.0–34.0)
MCHC: 32 g/dL (ref 30.0–36.0)
MCV: 96.8 fL (ref 80.0–100.0)
Monocytes Absolute: 1.1 10*3/uL — ABNORMAL HIGH (ref 0.1–1.0)
Monocytes Relative: 8 %
Neutro Abs: 9.9 10*3/uL — ABNORMAL HIGH (ref 1.7–7.7)
Neutrophils Relative %: 74 %
Platelets: 99 10*3/uL — ABNORMAL LOW (ref 150–400)
RBC: 2.52 MIL/uL — ABNORMAL LOW (ref 3.87–5.11)
RDW: 16.6 % — ABNORMAL HIGH (ref 11.5–15.5)
Smear Review: NORMAL
WBC: 13.3 10*3/uL — ABNORMAL HIGH (ref 4.0–10.5)
nRBC: 15.1 % — ABNORMAL HIGH (ref 0.0–0.2)

## 2020-07-24 LAB — RENAL FUNCTION PANEL
Albumin: 2.1 g/dL — ABNORMAL LOW (ref 3.5–5.0)
Anion gap: 12 (ref 5–15)
BUN: 53 mg/dL — ABNORMAL HIGH (ref 8–23)
CO2: 22 mmol/L (ref 22–32)
Calcium: 8.9 mg/dL (ref 8.9–10.3)
Chloride: 109 mmol/L (ref 98–111)
Creatinine, Ser: 3.38 mg/dL — ABNORMAL HIGH (ref 0.44–1.00)
GFR, Estimated: 14 mL/min — ABNORMAL LOW (ref >60)
Glucose, Bld: 139 mg/dL — ABNORMAL HIGH (ref 70–99)
Phosphorus: 6 mg/dL — ABNORMAL HIGH (ref 2.5–4.6)
Potassium: 4.9 mmol/L (ref 3.5–5.1)
Sodium: 143 mmol/L (ref 135–145)

## 2020-07-24 LAB — HEMOGLOBIN AND HEMATOCRIT, BLOOD
HCT: 25.2 % — ABNORMAL LOW (ref 36.0–46.0)
Hemoglobin: 8.1 g/dL — ABNORMAL LOW (ref 12.0–15.0)

## 2020-07-24 LAB — MAGNESIUM: Magnesium: 2 mg/dL (ref 1.7–2.4)

## 2020-07-24 LAB — GLUCOSE, CAPILLARY
Glucose-Capillary: 128 mg/dL — ABNORMAL HIGH (ref 70–99)
Glucose-Capillary: 116 mg/dL — ABNORMAL HIGH (ref 70–99)

## 2020-07-24 LAB — RPR: RPR Ser Ql: NONREACTIVE

## 2020-07-24 LAB — ANA: Anti Nuclear Antibody (ANA): NEGATIVE

## 2020-07-24 MED ORDER — PANTOPRAZOLE SODIUM 40 MG IV SOLR: 40.0000 mg | INTRAVENOUS | Status: DC

## 2020-07-24 MED ORDER — MORPHINE 100MG IN NS 100ML (1MG/ML) PREMIX INFUSION
0.0000 mg/h | INTRAVENOUS | Status: DC
  Administered 2020-07-24: 12:00:00 2 mg/h via INTRAVENOUS
  Filled 2020-07-24: qty 100

## 2020-07-24 MED ORDER — POLYVINYL ALCOHOL 1.4 % OP SOLN
1.0000 [drp] | Freq: Four times a day (QID) | OPHTHALMIC | Status: DC | PRN
  Filled 2020-07-24: qty 15

## 2020-07-24 MED ORDER — MORPHINE BOLUS VIA INFUSION
5.0000 mg | INTRAVENOUS | Status: DC | PRN
  Filled 2020-07-24: qty 5

## 2020-07-24 MED ORDER — GLYCOPYRROLATE 1 MG PO TABS
1.0000 mg | ORAL_TABLET | ORAL | Status: DC | PRN
  Filled 2020-07-24: qty 1

## 2020-07-24 MED ORDER — DIPHENHYDRAMINE HCL 50 MG/ML IJ SOLN: 25.0000 mg | INTRAMUSCULAR | Status: DC | PRN

## 2020-07-24 MED ORDER — GLYCOPYRROLATE 0.2 MG/ML IJ SOLN: 0.2000 mg | INTRAMUSCULAR | Status: DC | PRN

## 2020-07-24 MED ORDER — ACETAMINOPHEN 650 MG RE SUPP: 650.0000 mg | Freq: Four times a day (QID) | RECTAL | Status: DC | PRN

## 2020-07-24 MED ORDER — MORPHINE SULFATE (PF) 2 MG/ML IV SOLN: 2.0000 mg | INTRAVENOUS | Status: DC | PRN

## 2020-07-24 MED ORDER — DEXTROSE 5 % IV SOLN: INTRAVENOUS | Status: DC

## 2020-07-24 MED ORDER — ACETAMINOPHEN 325 MG PO TABS: 650.0000 mg | ORAL_TABLET | Freq: Four times a day (QID) | ORAL | Status: DC | PRN

## 2020-07-25 LAB — CULTURE, RESPIRATORY W GRAM STAIN: Culture: NO GROWTH

## 2020-07-26 LAB — VITAMIN B1: Vitamin B1 (Thiamine): 112.5 nmol/L (ref 66.5–200.0)

## 2020-07-26 NOTE — Progress Notes (Signed)
[facility], [place] 2020-07-28  Subjective:   LOS: 6 12/29 0701 - 12/30 0700 In: 679.1 [I.V.:619.1] Out: 100 [Urine:100]  Renal function continues to deteriorate.  Creatinine currently 3.3 with urine output of only 100 cc over the preceding 24 hours. Still on the ventilator. Overall remains critically ill. Objective:  Vital signs in last 24 hours:  Temp:  [95 F (35 C)-98.78 F (37.1 C)] 98.42 F (36.9 C) (12/30 1200) Pulse Rate:  [65-93] 73 (12/30 1200) Resp:  [0-30] 0 (12/30 1300) BP: (60-159)/(41-57) 129/51 (12/30 1200) SpO2:  [96 %-100 %] 97 % (12/30 1200) FiO2 (%):  [40 %-100 %] 100 % (12/30 0800) Weight:  [76.3 kg] 76.3 kg (12/30 1300)  Weight change:  Filed Weights   07/22/2020 1620 07/20/20 0500 Jul 28, 2020 1300  Weight: 72.6 kg 76.3 kg 76.3 kg    Intake/Output:    Intake/Output Summary (Last 24 hours) at Jul 28, 2020 1504 Last data filed at 07-28-2020 1108 Gross per 24 hour  Intake 2460.6 ml  Output 140 ml  Net 2320.6 ml     Physical Exam: General: Critically ill appearing  HEENT Endotracheal tube in place  Pulm/lungs bilateral rales and rhonchi, vent assisted  CVS/Heart Regular  Abdomen:  Soft, NT  Extremities: Trace edema  Neurologic: Intubated, sedated  Skin: No acute rashes  GU: Foley in place       Basic Metabolic Panel:  Recent Labs  Lab 07/19/20 1405 07/19/20 1527 07/20/20 0416 07/21/20 0609 07/22/20 0555 07/22/20 2042 07/23/20 0425 Jul 28, 2020 0441  NA 135  --  141 141 145 145 142 143  K 6.3*   < > 5.0 4.7 4.4 4.1 4.1 4.9  CL 103  --  106 108 111 111 109 109  CO2 19*  --  _0 GLUCOSE 119*  --  98 87 110* 108* 139* 139*  BUN 58*  --  54* 46* 40* 38* 39* 53*  CREATININE 3.75*  --  3.17* 2.72* 2.61* 2.22* 2.32* 3.38*  CALCIUM 9.2  --  8.8* 8.9 9.0 9.0 9.6 8.9  MG  --   --  1.8 2.3 2.0  --  2.0 2.0  PHOS 4.8*  --  4.3 3.7  --   --  6.0* 6.0*   < > = values in this interval not displayed.      CBC: Recent Labs  Lab 06/30/2020 1600 07/19/20 0426 07/20/20 0416 07/21/20 0609 07/22/20 0555 07/22/20 2042 07-28-20 0211 Jul 28, 2020 0441  WBC 11.4* 7.3 8.7 10.0 10.9* 12.2*  --  13.3*  NEUTROABS 8.5* 6.5  --   --   --  10.0*  --  9.9*  HGB 8.6* 8.8* 8.7* 7.8* 8.1* 8.1* 8.1* 7.8*  HCT 25.3* 26.6* 24.2* 22.9* 23.3* 24.7* 25.2* 24.4*  MCV 92.3 93.7 88.0 92.0 91.7 94.6  --  96.8  PLT 142* 152 101* 80* 82* 91*  --  99*     No results found for: HEPBSAG, HEPBSAB, HEPBIGM    Microbiology:  Recent Results (from the past 240 hour(s))  Blood culture (routine single)     Status: None   Collection Time: 07/08/2020  3:37 PM   Specimen: BLOOD  Result Value Ref Range Status   Specimen Description BLOOD BLOOD LEFT HAND  Final   Special Requests   Final    BOTTLES DRAWN AEROBIC AND ANAEROBIC Blood Culture results may not be optimal due to an inadequate volume of blood received in culture bottles   Culture  Final    NO GROWTH 5 DAYS Performed at [facility], [address] [place], [place] [address]    Report Status 07/23/2020 FINAL  Final  Resp Panel by RT-PCR (Flu A&B, Covid) Nasopharyngeal Swab     Status: None   Collection Time: 07/07/2020  4:00 PM   Specimen: Nasopharyngeal Swab; Nasopharyngeal(NP) swabs in vial transport medium  Result Value Ref Range Status   SARS Coronavirus 2 by RT PCR NEGATIVE NEGATIVE Final    Comment: (NOTE) SARS-CoV-2 target nucleic acids are NOT DETECTED.  The SARS-CoV-2 RNA is generally detectable in upper respiratory specimens during the acute phase of infection. The lowest concentration of SARS-CoV-2 viral copies this assay can detect is 138 copies/mL. A negative result does not preclude SARS-Cov-2 infection and should not be used as the sole basis for treatment or other patient management decisions. A negative result may occur with  improper specimen collection/handling, submission of specimen other than nasopharyngeal swab, presence  of viral mutation(s) within the areas targeted by this assay, and inadequate number of viral copies(<138 copies/mL). A negative result must be combined with clinical observations, patient history, and epidemiological information. The expected result is Negative.  Fact Sheet for Patients:  EntrepreneurPulse.com.au  Fact Sheet for Healthcare Providers:  IncredibleEmployment.be  This test is no t yet approved or cleared by the [place] FDA and  has been authorized for detection and/or diagnosis of SARS-CoV-2 by FDA under an Emergency Use Authorization (EUA). This EUA will remain  in effect (meaning this test can be used) for the duration of the COVID-19 declaration under Section 564(b)(1) of the Act, 21 U.S.C.section 360bbb-3(b)(1), unless the authorization is terminated  or revoked sooner.       Influenza A by PCR NEGATIVE NEGATIVE Final   Influenza B by PCR NEGATIVE NEGATIVE Final    Comment: (NOTE) The Xpert Xpress SARS-CoV-2/FLU/RSV plus assay is intended as an aid in the diagnosis of influenza from Nasopharyngeal swab specimens and should not be used as a sole basis for treatment. Nasal washings and aspirates are unacceptable for Xpert Xpress SARS-CoV-2/FLU/RSV testing.  Fact Sheet for Patients: EntrepreneurPulse.com.au  Fact Sheet for Healthcare Providers: IncredibleEmployment.be  This test is not yet approved or cleared by the [place] FDA and has been authorized for detection and/or diagnosis of SARS-CoV-2 by FDA under an Emergency Use Authorization (EUA). This EUA will remain in effect (meaning this test can be used) for the duration of the COVID-19 declaration under Section 564(b)(1) of the Act, 21 U.S.C. section 360bbb-3(b)(1), unless the authorization is terminated or revoked.  Performed at [facility], [address] [place], [place] [address]   MRSA PCR Screening      Status: None   Collection Time: 06/25/2020  5:47 PM   Specimen: Nasopharyngeal  Result Value Ref Range Status   MRSA by PCR NEGATIVE NEGATIVE Final    Comment:        The GeneXpert MRSA Assay (FDA approved for NASAL specimens only), is one component of a comprehensive MRSA colonization surveillance program. It is not intended to diagnose MRSA infection nor to guide or monitor treatment for MRSA infections. Performed at [facility], [address] [place], [place] [address]   Urine culture     Status: Abnormal   Collection Time: 07/19/20  4:10 AM   Specimen: In/Out Cath Urine  Result Value Ref Range Status   Specimen Description   Final    IN/OUT CATH URINE Performed at [facility], [address] [place], [place] [address]    Special Requests   Final    NONE Performed at [facility], [address] [place], [place] [address]    Culture 60,000 COLONIES/mL ENTEROBACTER AEROGENES (A)  Final   Report Status 07/21/2020 FINAL  Final   Organism ID, Bacteria ENTEROBACTER AEROGENES (A)  Final      Susceptibility   Enterobacter aerogenes - MIC*    CEFAZOLIN RESISTANT Resistant     CEFEPIME <=0.12 SENSITIVE Sensitive     CEFTRIAXONE <=0.25 SENSITIVE Sensitive     CIPROFLOXACIN <=0.25 SENSITIVE Sensitive     GENTAMICIN <=1 SENSITIVE Sensitive     IMIPENEM <=0.25 SENSITIVE Sensitive     NITROFURANTOIN 32 SENSITIVE Sensitive     TRIMETH/SULFA <=20 SENSITIVE Sensitive     PIP/TAZO <=4 SENSITIVE Sensitive     * 60,000 COLONIES/mL ENTEROBACTER AEROGENES  CULTURE, BLOOD (ROUTINE X 2) w Reflex to ID Panel     Status: None (Preliminary result)   Collection Time: 07/22/20  8:54 PM   Specimen: BLOOD  Result Value Ref Range Status   Specimen Description BLOOD RIGHT ARM  Final   Special Requests   Final    BOTTLES DRAWN AEROBIC AND ANAEROBIC Blood Culture adequate volume   Culture   Final    NO GROWTH 2 DAYS Performed at [facility], [address] [place], [place] [address]    Report Status PENDING  Incomplete  CULTURE, BLOOD (ROUTINE X 2) w Reflex to ID Panel     Status: None (Preliminary result)   Collection Time: 07/22/20  9:03 PM   Specimen: BLOOD  Result Value Ref Range Status   Specimen Description BLOOD LEFT HAND  Final   Special Requests   Final    BOTTLES DRAWN AEROBIC AND ANAEROBIC Blood Culture results may not be optimal due to an inadequate volume of blood received in culture bottles   Culture   Final    NO GROWTH 2 DAYS Performed at [facility], [address] [place], [place] [address]    Report Status PENDING  Incomplete  Resp Panel by RT-PCR (Flu A&B, Covid) Nasopharyngeal Swab     Status: None   Collection Time: 07/22/20  9:25 PM   Specimen: Nasopharyngeal Swab; Nasopharyngeal(NP) swabs in vial transport medium  Result Value Ref Range Status   SARS Coronavirus 2 by RT PCR NEGATIVE NEGATIVE Final    Comment: (NOTE) SARS-CoV-2 target nucleic acids are NOT DETECTED.  The SARS-CoV-2 RNA is generally detectable in upper respiratory specimens during the acute phase of infection. The lowest concentration of SARS-CoV-2 viral copies this assay can detect is 138 copies/mL. A negative result does not preclude SARS-Cov-2 infection and should not be used as the sole basis for treatment or other patient management decisions. A negative result may occur with  improper specimen collection/handling, submission of specimen other than nasopharyngeal swab, presence of viral mutation(s) within the areas targeted by this assay, and inadequate number of viral copies(<138 copies/mL). A negative result must be combined with clinical observations, patient history, and epidemiological information. The expected result is Negative.  Fact Sheet for Patients:  EntrepreneurPulse.com.au  Fact Sheet for Healthcare Providers:  IncredibleEmployment.be  This test is no t yet  approved or cleared by the [place] FDA and  has been authorized for detection and/or diagnosis of SARS-CoV-2 by FDA under an Emergency Use Authorization (EUA). This EUA will remain  in effect (meaning this test can be used) for the duration  of the COVID-19 declaration under Section 564(b)(1) of the Act, 21 U.S.C.section 360bbb-3(b)(1), unless the authorization is terminated  or revoked sooner.       Influenza A by PCR NEGATIVE NEGATIVE Final   Influenza B by PCR NEGATIVE NEGATIVE Final    Comment: (NOTE) The Xpert Xpress SARS-CoV-2/FLU/RSV plus assay is intended as an aid in the diagnosis of influenza from Nasopharyngeal swab specimens and should not be used as a sole basis for treatment. Nasal washings and aspirates are unacceptable for Xpert Xpress SARS-CoV-2/FLU/RSV testing.  Fact Sheet for Patients: EntrepreneurPulse.com.au  Fact Sheet for Healthcare Providers: IncredibleEmployment.be  This test is not yet approved or cleared by the [place] FDA and has been authorized for detection and/or diagnosis of SARS-CoV-2 by FDA under an Emergency Use Authorization (EUA). This EUA will remain in effect (meaning this test can be used) for the duration of the COVID-19 declaration under Section 564(b)(1) of the Act, 21 U.S.C. section 360bbb-3(b)(1), unless the authorization is terminated or revoked.  Performed at [facility], [address] [place], [place] [address]   Culture, respiratory (non-expectorated)     Status: None (Preliminary result)   Collection Time: 07/23/20  9:07 AM   Specimen: Tracheal Aspirate; Respiratory  Result Value Ref Range Status   Specimen Description   Final    TRACHEAL ASPIRATE Performed at [facility], [address] [place], [place] [address]    Special Requests   Final    NONE Performed at [facility], [place], [place] [address]    Gram Stain   Final     RARE WBC PRESENT,BOTH PMN AND MONONUCLEAR NO ORGANISMS SEEN    Culture   Final    NO GROWTH < 24 HOURS Performed at [facility], [address] [place], [place] [address]    Report Status PENDING  Incomplete    Coagulation Studies: No results for input(s): LABPROT, INR in the last 72 hours.  Urinalysis: No results for input(s): COLORURINE, LABSPEC, PHURINE, GLUCOSEU, HGBUR, BILIRUBINUR, KETONESUR, PROTEINUR, UROBILINOGEN, NITRITE, LEUKOCYTESUR in the last 72 hours.  Invalid input(s): APPERANCEUR    Imaging: DG Chest 1 View  Result Date: 07/22/2020 CLINICAL DATA:  Intubation. EXAM: CHEST  1 VIEW COMPARISON:  Radiograph earlier today. FINDINGS: Endotracheal tube tip 2.9 cm from the carina. Right internal jugular dialysis catheter tip at the atrial caval junction, unchanged. No pneumothorax. Stable heart size and mediastinal contours. No significant change in the diffuse bilateral lung opacities from earlier today. Slight perihilar predominance. No large pleural effusion. Remote left clavicle fracture. IMPRESSION: 1. Endotracheal tube tip 2.9 cm from the carina. 2. Unchanged diffuse bilateral lung opacities from earlier today, edema versus multifocal pneumonia. Electronically Signed   By: Keith Rake M.D.   On: 07/22/2020 21:53   DG Abd 1 View  Result Date: 07/23/2020 CLINICAL DATA:  NG tube placement EXAM: ABDOMEN - 1 VIEW COMPARISON:  CT 11/06/2019 FINDINGS: Esophageal tube tip overlies the proximal to mid stomach, side-port in the region of gastric fundus. Visualized abdomen is relatively gasless. IMPRESSION: Esophageal tube tip overlies the proximal to mid stomach. Electronically Signed   By: Donavan Foil M.D.   On: 07/23/2020 18:10   CT HEAD WO CONTRAST  Result Date: 07/23/2020 CLINICAL DATA:  Altered mental status EXAM: CT HEAD WITHOUT CONTRAST TECHNIQUE: Contiguous axial images were obtained from the base of the skull through the vertex without intravenous  contrast. COMPARISON:  MRI brain dated 07/11/2020 FINDINGS: Brain: No  evidence of acute infarction, hemorrhage, hydrocephalus, extra-axial collection or mass lesion/mass effect. Mild subcortical white matter and periventricular small vessel ischemic changes. Vascular: Intracranial atherosclerosis. Skull: Normal. Negative for fracture or focal lesion. Sinuses/Orbits: The visualized paranasal sinuses are essentially clear. The mastoid air cells are unopacified. Other: None. IMPRESSION: No evidence of acute intracranial abnormality. Mild small vessel ischemic changes. Electronically Signed   By: Julian Hy M.D.   On: 07/23/2020 14:33   DG Chest Port 1 View  Result Date: 07/22/2020 CLINICAL DATA:  Follow-up shortness of breath. EXAM: PORTABLE CHEST 1 VIEW COMPARISON:  07/21/2020 and multiple previous FINDINGS: Artifact overlies the chest. Right internal jugular central line tip again at the SVC RA junction. Persistence and probable slight worsening of diffuse airspace lung density. Findings could be due to edema, ARDS or pneumonia. No new qualitative finding. No effusion. Bony structures unremarkable. IMPRESSION: Persistent and probable slight worsening of diffuse airspace density. Findings could be due to edema, ARDS or pneumonia. Electronically Signed   By: Nelson Chimes M.D.   On: 07/22/2020 20:37     Medications:   . sodium chloride Stopped (07/21/20 1708)  . dextrose 30 mL/hr at 2020-07-28 1108  . dextrose    . fentaNYL infusion INTRAVENOUS Stopped (July 28, 2020 1157)  . midazolam Stopped (2020/07/28 1157)  . morphine 2 mg/hr (2020/07/28 1155)  . norepinephrine (LEVOPHED) Adult infusion Stopped (07/28/2020 1219)  . phenylephrine (NEO-SYNEPHRINE) Adult infusion Stopped (07/23/20 0108)  . vasopressin Stopped (28-Jul-2020 1219)   . chlorhexidine gluconate (MEDLINE KIT)  15 mL Mouth Rinse BID  . Chlorhexidine Gluconate Cloth  6 each Topical Q0600  . docusate  100 mg Per Tube BID  . pantoprazole  (PROTONIX) IV  40 mg Intravenous Q24H  . polyethylene glycol  17 g Per Tube Daily  . sodium chloride flush  10-40 mL Intracatheter Q12H   sodium chloride, acetaminophen **OR** acetaminophen, albuterol, diphenhydrAMINE, fentaNYL, fentaNYL (SUBLIMAZE) injection, fentaNYL (SUBLIMAZE) injection, glycopyrrolate **OR** glycopyrrolate **OR** glycopyrrolate, haloperidol lactate, heparin sodium (porcine), midazolam, midazolam, morphine injection, morphine injection, morphine, ondansetron (ZOFRAN) IV, polyvinyl alcohol, sodium chloride flush  Assessment/ Plan:  75 y.o. 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  Acute kidney injury on CKD st 2. Baseline creatinine 1.0/GFR 66 from January 17, 2020 Recent Labs    07/22/20 0555 07/22/20 2042 07/23/20 0425 2020-07-28 0441  CREATININE 2.61* 2.22* 2.32* 3.38*  Urinalysis December 25 shows protein 100 mg/dL, 21-50 RBCs, 11-20 WBCs Covid panel is negative  AKI likely secondary to ATN due to hemodynamic instability from bradycardia and cardiac arrest on 07/22/2020 -Renal function deteriorating again.  Creatinine up to 3.3 with urine output only of 100 cc over the preceding 24 hours.  If oliguria persists may need to revisit dialysis.  Currently potassium acceptable at 4.9.  #. Anemia of CKD.  Lab Results  Component Value Date   HGB 7.8 (L) 07/28/2020   Hemoglobin 7.8 at last check.  Continue to monitor CBC.   #.  Bradycardia and hypotension Required Temporary pacemaker    # Acute resp failure/asystolic cardiac arrest 22/63/3354 Patient still requiring ventilatory support post cardiac arrest.  Overall prognosis guarded.    LOS: 6 Jahari Billy Jan 03, 20223:04 PM  [place], [place]

## 2020-07-26 NOTE — Consult Note (Signed)
Consultation Note Date: 08/10/2020   Patient Name: Julie Roberts  DOB: 07-Feb-1946  MRN: [id number]  Age / Sex: 75 y.o., female  PCP: Donnamarie Rossetti, PA-C Referring Physician: Candee Furbish, MD  Reason for Consultation: Establishing goals of care and Psychosocial/spiritual support  HPI/Patient Profile: 75 y.o. female   admitted on 07/11/2020 with with encephalopathy.  Daughter states despite significant comorbid history she was able to drive 1 month ago and does all ADLs on her own.  She has been unsteady on feet with disequilibrium over past 2 months and she has had MRI brian for possible CVA on outpatient.  She was brought to ED due to worsening dizziness and altered mentation and found to have profound bradycardia in the teens.She had transcutaneous pacing placed. She was emergently seen by cardiology who was able to place transvenous TPM.  She was also noted to have A/CKD and hypothermia with admission to MICU for additional evaluation and management.   07-23-20-patient with significant worsening of respiratory status requiring BiPAP overnight.  Subsequently intubated.  About 2 hours after intubation patient suffered 3-minute cardiac arrest.  Patient currently sedated on the vent.    Long-term poor prognosis  Family face treatment option decisions, advanced directive decisions and anticipatory care needs  Clinical Assessment and Goals of Care:   This NP Wadie Lessen reviewed medical records, received report from team, assessed the patient and then meet at the patient's bedside along with her three children, Ree Kida and Jeneen Rinks to discuss diagnosis, prognosis, GOC, EOL wishes disposition and options.   Concept of Palliative Care was introduced as specialized medical care for people and their families living with serious illness.  If focuses on providing relief from the symptoms and stress of a  serious illness.  The goal is to improve quality of life for both the patient and the family.   Values and goals of care important to patient and family were attempted to be elicited.  Created space and opportunity for patient  and family to explore thoughts and feelings regarding current medical situation.  All family members present verbalize an understanding of the seriousness of the patient's current medical situation and associated sequelae  /poor prognosis.  Family speak to the importance of quality for this patient and not wanting the patient to suffer.  Education and conversation regarding concept specific to human mortality and the limitations of medical interventions to provide quality of life when the body fails to thrive.  Ultimately comfort, quality and dignity are of top priority.   A  discussion was had today regarding advanced directives.  Concepts specific to code status, artifical feeding and hydration, continued IV antibiotics and rehospitalization was had.    The difference between a aggressive medical intervention path  and a palliative comfort care path for this patient at this time was had.     Natural trajectory and expectations at EOL were discussed.    Family wish to speak to critical care doctor before making definitive decision to shift to a full comfort  path.  Discussed with Dr. Smith/CCM family gathered at bedside.   Questions and concerns addressed.  Patient  encouraged to call with questions or concerns.     PMT will continue to support holistically.          No documented H POA.  3 of the patient's 4 children are gathered here today and all are in agreement with decision to focus on comfort at this time for their mother.    SUMMARY OF RECOMMENDATIONS    Code Status/Advance Care Planning:  DNR   Symptom Management:   Patient to be liberated from the vent and CCM has put in comfort measures/see MAR.  Palliative Prophylaxis:   Eye Care,  Frequent Pain Assessment and Oral Care  Additional Recommendations (Limitations, Scope, Preferences):  Full Comfort Care  Psycho-social/Spiritual:   Desire for further Chaplaincy support:yes  Additional Recommendations: Grief/Bereavement Support  Prognosis:   Hours - Days  Discharge Planning: Anticipated Hospital Death      Primary Diagnoses: Present on Admission: . Symptomatic bradycardia   I have reviewed the medical record, interviewed the patient and family, and examined the patient. The following aspects are pertinent.  Past Medical History:  Diagnosis Date  . Asthma   . Hypertension    Social History   Socioeconomic History  . Marital status: Divorced    Spouse name: Not on file  . Number of children: Not on file  . Years of education: Not on file  . Highest education level: Not on file  Occupational History  . Not on file  Tobacco Use  . Smoking status: Not on file  . Smokeless tobacco: Not on file  Substance and Sexual Activity  . Alcohol use: Not on file  . Drug use: Not on file  . Sexual activity: Not on file  Other Topics Concern  . Not on file  Social History Narrative  . Not on file   Social Determinants of Health   Financial Resource Strain: Not on file  Food Insecurity: Not on file  Transportation Needs: Not on file  Physical Activity: Not on file  Stress: Not on file  Social Connections: Not on file   Family History  Problem Relation Age of Onset  . Breast cancer Neg Hx    Scheduled Meds: . chlorhexidine gluconate (MEDLINE KIT)  15 mL Mouth Rinse BID  . Chlorhexidine Gluconate Cloth  6 each Topical Q0600  . docusate  100 mg Per Tube BID  . ipratropium-albuterol  3 mL Nebulization Q6H  . mouth rinse  15 mL Mouth Rinse 10 times per day  . polyethylene glycol  17 g Per Tube Daily  . sodium chloride flush  10-40 mL Intracatheter Q12H   Continuous Infusions: . sodium chloride Stopped (07/21/20 1708)  . ceFEPime (MAXIPIME) IV  Stopped (2020-08-01 0824)  . dextrose 30 mL/hr at 2020/08/01 0828  . fentaNYL infusion INTRAVENOUS 50 mcg/hr (08-01-20 0828)  . midazolam 2 mg/hr (07/23/20 1013)  . norepinephrine (LEVOPHED) Adult infusion 30 mcg/min (01-Aug-2020 0828)  . phenylephrine (NEO-SYNEPHRINE) Adult infusion Stopped (07/23/20 0108)  . vasopressin 0.03 Units/min (08/01/2020 0828)   PRN Meds:.sodium chloride, albuterol, fentaNYL, fentaNYL (SUBLIMAZE) injection, fentaNYL (SUBLIMAZE) injection, haloperidol lactate, heparin sodium (porcine), midazolam, midazolam, morphine injection, ondansetron (ZOFRAN) IV, sodium chloride flush Medications Prior to Admission:  Prior to Admission medications   Medication Sig Start Date End Date Taking? Authorizing Provider  allopurinol (ZYLOPRIM) 100 MG tablet Take 200 mg by mouth daily. 05/05/20  Yes [provider]  amLODipine (  NORVASC) 10 MG tablet Take 10 mg by mouth daily. 05/05/20  Yes [provider]  aspirin 81 MG EC tablet Take 81 mg by mouth daily at 6 (six) AM.   Yes [provider]  atorvastatin (LIPITOR) 20 MG tablet Take 20 mg by mouth daily. 07/16/20  Yes [provider]  fluticasone (FLONASE) 50 MCG/ACT nasal spray Place 1 spray into both nostrils daily at 6 (six) AM. 07/04/20  Yes [provider]  hydroxychloroquine (PLAQUENIL) 200 MG tablet Take 200 mg by mouth 2 (two) times daily. 05/05/20  Yes [provider]  levocetirizine (XYZAL) 5 MG tablet Take 5 mg by mouth daily. 05/06/20  Yes [provider]  losartan (COZAAR) 100 MG tablet Take 100 mg by mouth daily. 05/05/20  Yes [provider]  Omega-3 Fatty Acids (FISH OIL) 1360 MG CAPS Take 2 capsules by mouth daily at 6 (six) AM.   Yes [provider]  psyllium (METAMUCIL SMOOTH TEXTURE) 28 % packet Take 1 packet by mouth daily.   Yes [provider]   Allergies  Allergen Reactions  . Penicillins Hives, Palpitations and Swelling    Pt  states has swelling, hives, and palpitations when has any penicillins.   Pt states has swelling, hives, and palpitations when has any penicillins.   Pt states has swelling, hives, and palpitations when has any penicillins.     . Ace Inhibitors Other (See Comments)    Cough Cough    Review of Systems  Unable to perform ROS   Physical Exam Constitutional:      Appearance: She is ill-appearing.     Interventions: She is intubated.  Cardiovascular:     Rate and Rhythm: Normal rate.  Pulmonary:     Effort: She is intubated.  Skin:    General: Skin is warm and dry.     Vital Signs: BP (!) 128/50   Pulse 74   Temp 97.88 F (36.6 C)   Resp (!) 30   Ht '5\' 3"'  (1.6 m)   Wt 76.3 kg   SpO2 97%   BMI 29.80 kg/m  Pain Scale: CPOT   Pain Score: 0-No pain   SpO2: SpO2: 97 % O2 Device:SpO2: 97 % O2 Flow Rate: .O2 Flow Rate (L/min): 12 L/min  IO: Intake/output summary:   Intake/Output Summary (Last 24 hours) at 08/11/20 8938 Last data filed at 2020-08-11 1017 Gross per 24 hour  Intake 2268.26 ml  Output 120 ml  Net 2148.26 ml    LBM: Last BM Date: 07/23/20 Baseline Weight: Weight: 72.6 kg Most recent weight: Weight: 76.3 kg     Palliative Assessment/Data:    Discussed with Dr Tamala Julian  Time In: 0930 Time Out: 1040 Time Total: 70 minutes Greater than 50%  of this time was spent counseling and coordinating care related to the above assessment and plan.  Signed by: Wadie Lessen, NP   Please contact Palliative Medicine Team phone at [phone] for questions and concerns.  For individual provider: See Shea Evans

## 2020-07-26 NOTE — Progress Notes (Signed)
Pt terminally extubated at this time. Vaso and levo gtt stopped.

## 2020-07-26 NOTE — Progress Notes (Signed)
Silver ring left on pt's right ring finger when sent to morgue.

## 2020-07-26 NOTE — Progress Notes (Addendum)
NAME:  Julie Roberts, MRN:  [id number], DOB:  October 04, 1945, LOS: 6 ADMISSION DATE:  06/25/2020, CONSULTATION DATE:   REFERRING MD:  , CHIEF COMPLAINT:  Respiratory failure    History of Present Illness:  75 yo F PMH as below, daughter gives history as patient is with encephalopathy.  Daughter states despite significant comorbid history she was able to drive 1 month ago and does all ADLs on her own.  She has been unsteady on feet with disequilibrium over past 2 months and she has had MRI brian for possible CVA on outpatient.  She was brought to ED due to worsening dizziness and altered mentation and found to have profound bradycardia in the teens.She had transcutaneous pacing placed. She was emergently seen by cardiology who was able to place transvenous TPM.  She was also noted to have A/CKD and hypothermia with admission to MICU for additional evaluation and management.   Past Medical History:   has a past medical history of Asthma and Hypertension.   Significant Hospital Events:   07/19/20- patient is improved.  I spoke with Dr Acie Fredrickson regarding cardiology plan-TPM removed from [facility]. Daughter at bedside reviewed care plan. Neurology consult for confusion.   07/20/20- patient on BIPAP.  Plan to wean precedex today and remove NIV.  Met with daughter of patient Julie Roberts and Julie Roberts.    07/21/20-Patient able to answer simple question and follow commands  07/22/20-Patient with intermittent tachycardia overnight. Got precedex for agitation as well.   07-23-20-patient with significant worsening of respiratory status requiring BiPAP overnight.  Subsequently intubated.  About 2 hours after intubation patient suffered 3-minute cardiac arrest.  Patient currently sedated on the vent.  Had extended conversation with Julie Roberts the patient's daughter.  She seems understanding that overall patient has a poor prognosis and will likely not recover to the relatively limited status she had before.  She would not  want her to have a low quality of life which is almost undoubtedly what patient will have.  She is thinking about making patient DNR and will get back to [place] on that.  She seems open to the idea of making patient comfortable in the future if current care is found to be futile   12/30 Palliative care meeting to confirm [name]:  Neurology Cardiology  Procedures:  12/28 Art line 12/28 ETT 12/25 CVC  Significant Diagnostic Tests:  12/30 CT head>>no acute findings  Micro Data:  12/29 Respiratory culture >> 12/28 BCx2>> 12/28 Covid-19, flu>>negative  Antimicrobials:  Vancomycin 12/28 Cefepime 12/29- Cipro 12/27-12/28 Meropenem 12/27-12/28 Doxycyline 12/24-12/25   Interim History / Subjective:  Meeting with palliative care today, no overnight events  Objective   Blood pressure (!) 128/50, pulse 74, temperature 97.88 F (36.6 C), resp. rate (!) 30, height '5\' 3"'  (1.6 m), weight 76.3 kg, SpO2 97 %.    Vent Mode: Volume support FiO2 (%):  [40 %-100 %] 100 % Set Rate:  [30 bmp] 30 bmp Vt Set:  [400 mL] 400 mL PEEP:  [14 cmH20] 14 cmH20 Plateau Pressure:  [28 cmH20-30 cmH20] 28 cmH20   Intake/Output Summary (Last 24 hours) at 2020-08-11 0930 Last data filed at 2020-08-11 3612 Gross per 24 hour  Intake 2268.26 ml  Output 120 ml  Net 2148.26 ml   Filed Weights   07/07/2020 1620 07/20/20 0500  Weight: 72.6 kg 76.3 kg    General:  Elderly F, intubated and sedated HEENT: MM pink/moist, ETT in place, pupils 35m and responsive Neuro: unresponsive to voice or  pain on versed and fentanyl, triggering breaths, +gag, no corneals CV: s1s2 tachycardic, regular, no m/r/g PULM:  On full vent support, mechanical breath sounds bilaterally GI: soft, bsx4 active  Extremities: warm/dry, no edema  Skin: no rashes or lesions   Resolved Hospital Problem list     Assessment & Plan:    Acute hypoxic respiratory failure-likely secondary to aspiration Remains on full vent support,  multi-focal PNA on CXR P: -Follow-up sputum cultures -Continue Cefepime, cultures NGTD -Palliative care consult -Maintain full vent support with SAT/SBT as tolerated -titrate Vent setting to maintain SpO2 greater than or equal to 90%, still requiring 100% FiO2 -HOB elevated 30 degrees. -Plateau pressures less than 30 cm H20.  -Follow chest x-ray, ABG prn.   -Bronchial hygiene and RT/bronchodilator protocol. -respiratory acidosis improved on last VBG     Cardiac arrest-likely secondary to acidosis -A large component of this was respiratory and mixed with metabolic. -This was reportedly PEA arrest no need for cooling  P: -maintain normothermia   -echo done 12/25, LVEF 55-60%   Acute on chronic renal failure Creatinine worsening to 3.3, minimal UOP P: -seen by nephrology yesterday, if family wants aggressive care then will need to consider HD, K ok today -follow BMP, UOP, consider lasix trial    Acute Encephalopathy -Patient with waxing and waning mental status  P: -CT head without acute findings -Consider EEG/MRI for neuroprognostication pending palliative care meeting a -RPR, and studies for lupus were ordered and pending     Symptomatic bradyarrythmia Cardiology following, transvenous pacemaker placed,  likely due to CKD with reaccumulation of BB P: -this improved and pacer wire was pulled 12/25, HR stable      Enterobacter UTI Sensitive to Cefepime P: -continue abx, will likely need 7 day course  **After meeting with Palliative Care, family decided to transition to full comfort care-orders placed and code status changed to DNR**  Best practice (evaluated daily)  Diet: TF Pain/Anxiety/Delirium protocol (if indicated): Versed and Fentanyl VAP protocol (if indicated): HOB 30 degrees, suction prn DVT prophylaxis: heparin GI prophylaxis: protonix Glucose control: SSI Mobility: bed rest Disposition:ICU  Goals of Care:  Last date of multidisciplinary goals of  care discussion: 12/30 Family and staff present: Palliative care meeting with multiple family members Summary of discussion: pending Follow up goals of care discussion due: 1/6 Code Status: DNR  Labs   CBC: Recent Labs  Lab 07/21/2020 1600 07/19/20 0426 07/20/20 0416 07/21/20 0609 07/22/20 0555 07/22/20 2042 08/16/2020 0211 08/16/2020 0441  WBC 11.4* 7.3 8.7 10.0 10.9* 12.2*  --  13.3*  NEUTROABS 8.5* 6.5  --   --   --  10.0*  --  9.9*  HGB 8.6* 8.8* 8.7* 7.8* 8.1* 8.1* 8.1* 7.8*  HCT 25.3* 26.6* 24.2* 22.9* 23.3* 24.7* 25.2* 24.4*  MCV 92.3 93.7 88.0 92.0 91.7 94.6  --  96.8  PLT 142* 152 101* 80* 82* 91*  --  99*    Basic Metabolic Panel: Recent Labs  Lab 07/19/20 1405 07/19/20 1527 07/20/20 0416 07/21/20 0609 07/22/20 0555 07/22/20 2042 07/23/20 0425 Aug 16, 2020 0441  NA 135  --  141 141 145 145 142 143  K 6.3*   < > 5.0 4.7 4.4 4.1 4.1 4.9  CL 103  --  106 108 111 111 109 109  CO2 19*  --  '26 25 25 24 23 22  ' GLUCOSE 119*  --  98 87 110* 108* 139* 139*  BUN 58*  --  54* 46* 40* 38* 39*  53*  CREATININE 3.75*  --  3.17* 2.72* 2.61* 2.22* 2.32* 3.38*  CALCIUM 9.2  --  8.8* 8.9 9.0 9.0 9.6 8.9  MG  --   --  1.8 2.3 2.0  --  2.0 2.0  PHOS 4.8*  --  4.3 3.7  --   --  6.0* 6.0*   < > = values in this interval not displayed.   GFR: Estimated Creatinine Clearance: 14.3 mL/min (A) (by C-G formula based on SCr of 3.38 mg/dL (H)). Recent Labs  Lab 07/03/2020 1600 06/28/2020 2319 07/19/20 0426 07/20/20 0416 07/21/20 0609 07/22/20 0555 07/22/20 2042 Aug 23, 2020 0441  PROCALCITON  --  <0.10 0.41 4.98  --   --   --   --   WBC 11.4*  --  7.3 8.7 10.0 10.9* 12.2* 13.3*  LATICACIDVEN 3.3* 2.5*  --   --   --   --  1.7  --     Liver Function Tests: Recent Labs  Lab 07/16/2020 1600 07/19/20 0426 07/20/20 0416 07/21/20 0609 07/22/20 2042 07/23/20 0425 08-23-2020 0441  AST 61*  --   --   --  47*  --   --   ALT 56*  --   --   --  37  --   --   ALKPHOS 152*  --   --   --  129*  --    --   BILITOT 0.5  --   --   --  1.1  --   --   PROT 6.2*  --   --   --  6.1*  --   --   ALBUMIN 3.1*   < > 2.4* 2.2* 2.3* 2.3* 2.1*   < > = values in this interval not displayed.   No results for input(s): LIPASE, AMYLASE in the last 168 hours. Recent Labs  Lab 07/19/20 2025  AMMONIA 16    ABG    Component Value Date/Time   PHART 7.29 (L) 07/23/2020 2300   PCO2ART 46 07/23/2020 2300   PO2ART 59 (L) 07/23/2020 2300   HCO3 22.1 07/23/2020 2300   ACIDBASEDEF 4.3 (H) 07/23/2020 2300   O2SAT 86.7 07/23/2020 2300     Coagulation Profile: Recent Labs  Lab 07/25/2020 1600  INR 1.0    Cardiac Enzymes: No results for input(s): CKTOTAL, CKMB, CKMBINDEX, TROPONINI in the last 168 hours.  HbA1C: No results found for: HGBA1C  CBG: Recent Labs  Lab 07/23/20 1949 07/23/20 2309 07/23/20 2347 08-23-2020 0332 2020-08-23 0724  GLUCAP 104* 117* 118* 128* 116*    Review of Systems:   Unable to obtain secondary to mental status  Past Medical History:  She,  has a past medical history of Asthma and Hypertension.   Surgical History:   Past Surgical History:  Procedure Laterality Date  . BREAST BIOPSY Left 2013   neg  . TEMPORARY PACEMAKER N/A 07/04/2020   Procedure: TEMPORARY PACEMAKER;  Surgeon: Wellington Hampshire, MD;  Location: [facility];  Service: Cardiovascular;  Laterality: N/A;     Social History:      Family History:  Her family history is negative for Breast cancer.   Allergies Allergies  Allergen Reactions  . Penicillins Hives, Palpitations and Swelling    Pt states has swelling, hives, and palpitations when has any penicillins.   Pt states has swelling, hives, and palpitations when has any penicillins.   Pt states has swelling, hives, and palpitations when has any penicillins.     . Ace  Inhibitors Other (See Comments)    Cough Cough      Home Medications  Prior to Admission medications   Medication Sig Start Date End Date Taking?  Authorizing Provider  allopurinol (ZYLOPRIM) 100 MG tablet Take 200 mg by mouth daily. 05/05/20  Yes [provider]  amLODipine (NORVASC) 10 MG tablet Take 10 mg by mouth daily. 05/05/20  Yes [provider]  aspirin 81 MG EC tablet Take 81 mg by mouth daily at 6 (six) AM.   Yes [provider]  atorvastatin (LIPITOR) 20 MG tablet Take 20 mg by mouth daily. 07/16/20  Yes [provider]  fluticasone (FLONASE) 50 MCG/ACT nasal spray Place 1 spray into both nostrils daily at 6 (six) AM. 07/04/20  Yes [provider]  hydroxychloroquine (PLAQUENIL) 200 MG tablet Take 200 mg by mouth 2 (two) times daily. 05/05/20  Yes [provider]  levocetirizine (XYZAL) 5 MG tablet Take 5 mg by mouth daily. 05/06/20  Yes [provider]  losartan (COZAAR) 100 MG tablet Take 100 mg by mouth daily. 05/05/20  Yes [provider]  Omega-3 Fatty Acids (FISH OIL) 1360 MG CAPS Take 2 capsules by mouth daily at 6 (six) AM.   Yes [provider]  psyllium (METAMUCIL SMOOTH TEXTURE) 28 % packet Take 1 packet by mouth daily.   Yes [provider]     Critical care time: 35 minutes    CRITICAL CARE Performed by: Otilio Carpen Lulabelle Desta   Total critical care time: 35 minutes  Critical care time was exclusive of separately billable procedures and treating other patients.  Critical care was necessary to treat or prevent imminent or life-threatening deterioration.  Critical care was time spent personally by me on the following activities: development of treatment plan with patient and/or surrogate as well as nursing, discussions with consultants, evaluation of patient's response to treatment, examination of patient, obtaining history from patient or surrogate, ordering and performing treatments and interventions, ordering and review of laboratory studies, ordering and review of radiographic studies, pulse oximetry and re-evaluation of  patient's condition.   Otilio Carpen Isaic Syler, PA-C

## 2020-07-26 NOTE — Progress Notes (Signed)
TOD 1258. Verified with Adron Bene, RN.

## 2020-07-26 NOTE — Progress Notes (Signed)
Pt to morgue at this time.

## 2020-07-26 NOTE — Progress Notes (Signed)
PHARMACY CONSULT NOTE  Pharmacy Consult for Electrolyte Monitoring and Replacement   Recent Labs: Potassium (mmol/L)  Date Value  08-12-20 4.9   Magnesium (mg/dL)  Date Value  42/70/6237 2.0   Calcium (mg/dL)  Date Value  62/83/1517 8.9   Albumin (g/dL)  Date Value  61/60/7371 2.1 (L)   Phosphorus (mg/dL)  Date Value  01/19/9484 6.0 (H)   Sodium (mmol/L)  Date Value  08-12-2020 143    Assessment:  75 y.o. female with a hx of essential hypertension, hyperlipidemia, chronic kidney disease, COPD and recent memory problems. Current problems include AKI, hypotension, bradycardia. Patient's condition deteriorated the evening of 12/28 when patient became severely hypoxemic and went into sudden asystole requiring ACLS with achievement of ROSC after 4 minutes. She was further intubated and remains intubated, sedated, and on MV. On the early morning of 12/29 patient became bradycardic to the 20s which improved to the high 40s with atropine. Renal function is worsening. Nephrology following and considering RRT if no improvement.   MIVF: 10% dextrose @ 30 mL/hr  Goal of Therapy:  Electrolytes WNL  Plan:   No electrolyte replacement warranted today  Re-check electrolytes tomorrow AM  Tressie Ellis 08/12/2020 8:53 AM

## 2020-07-26 NOTE — Progress Notes (Signed)
Pt transitioning to comfort care. Morphine gtt started at this time and fentanyl and versed gtt stopped.

## 2020-07-26 NOTE — Therapy (Signed)
Pt extubated to 3lpm [place]. Pt is comfort care.

## 2020-07-26 DEATH — deceased

## 2020-07-27 LAB — CULTURE, BLOOD (ROUTINE X 2)
Culture: NO GROWTH
Culture: NO GROWTH
Special Requests: ADEQUATE

## 2020-08-26 NOTE — Death Summary Note (Signed)
DEATH SUMMARY   Patient Details  Name: Julie ChimesLillian Roberts MRN: [id number] DOB: 03/25/1946  Admission/Discharge Information   Admit Date:  2020/04/10  Date of Death: Date of Death: 07/02/2020  Time of Death: Time of Death: 1258  Length of Stay: 6  Referring Physician: Wilford CornerWhitaker, Jason Hestle, PA-C   Reason(s) for Hospitalization  SOB  Diagnoses    Brief Hospital Course (including significant findings, care, treatment, and services provided and events leading to death)  "75 yo F PMH as below, daughter gives history as patient is with encephalopathy. Daughter states despite significant comorbid history she was able to drive 1 month ago and does all ADLs on her own. She has been unsteady on feet with disequilibrium over past 2 months and she has had MRI brian for possible CVA on outpatient. She was brought to ED due to worsening dizziness and altered mentation and found to have profound bradycardia in the teens.She had transcutaneous pacing placed. She was emergently seen by cardiology who was able to place transvenous TPM. She was also noted to have A/CKD and hypothermia with admission to MICU for additional evaluation and management. "  Patient initially improved after transvenous pacing from a hemodynamic standpoint but had persistent confusion and agitation.  On 12/29 she suffered what was thought to be an aspiration event and required emergent intubation.  Post-intubation patient suffered a cardiac arrest.  Patient had apparently been languishing at home and had stated to her family she would not want any long term life support.  After discussions between family, ICU team, and palliative care, patient was terminally extubated and allowed to pass in peace with family at bedside.   Pertinent Labs and Studies  Significant Diagnostic Studies DG Chest 1 View  Result Date: 07/22/2020 CLINICAL DATA:  Intubation. EXAM: CHEST  1 VIEW COMPARISON:  Radiograph earlier today. FINDINGS: Endotracheal  tube tip 2.9 cm from the carina. Right internal jugular dialysis catheter tip at the atrial caval junction, unchanged. No pneumothorax. Stable heart size and mediastinal contours. No significant change in the diffuse bilateral lung opacities from earlier today. Slight perihilar predominance. No large pleural effusion. Remote left clavicle fracture. IMPRESSION: 1. Endotracheal tube tip 2.9 cm from the carina. 2. Unchanged diffuse bilateral lung opacities from earlier today, edema versus multifocal pneumonia. Electronically Signed   By: Narda RutherfordMelanie  Sanford M.D.   On: 07/22/2020 21:53   DG Abd 1 View  Result Date: 07/23/2020 CLINICAL DATA:  NG tube placement EXAM: ABDOMEN - 1 VIEW COMPARISON:  CT 11/06/2019 FINDINGS: Esophageal tube tip overlies the proximal to mid stomach, side-port in the region of gastric fundus. Visualized abdomen is relatively gasless. IMPRESSION: Esophageal tube tip overlies the proximal to mid stomach. Electronically Signed   By: Jasmine PangKim  Fujinaga M.D.   On: 07/23/2020 18:10   CT HEAD WO CONTRAST  Result Date: 07/23/2020 CLINICAL DATA:  Altered mental status EXAM: CT HEAD WITHOUT CONTRAST TECHNIQUE: Contiguous axial images were obtained from the base of the skull through the vertex without intravenous contrast. COMPARISON:  MRI brain dated 07/11/2020 FINDINGS: Brain: No evidence of acute infarction, hemorrhage, hydrocephalus, extra-axial collection or mass lesion/mass effect. Mild subcortical white matter and periventricular small vessel ischemic changes. Vascular: Intracranial atherosclerosis. Skull: Normal. Negative for fracture or focal lesion. Sinuses/Orbits: The visualized paranasal sinuses are essentially clear. The mastoid air cells are unopacified. Other: None. IMPRESSION: No evidence of acute intracranial abnormality. Mild small vessel ischemic changes. Electronically Signed   By: Charline BillsSriyesh  Krishnan M.D.   On: 07/23/2020 14:33  MR BRAIN WO CONTRAST  Result Date:  07/12/2020 CLINICAL DATA:  Gait disturbance.  Speech abnormality.  Headache. EXAM: MRI HEAD WITHOUT CONTRAST TECHNIQUE: Multiplanar, multiecho pulse sequences of the brain and surrounding structures were obtained without intravenous contrast. COMPARISON:  None. FINDINGS: Brain: Diffusion imaging does not show any acute or subacute infarction or other cause of restricted diffusion. Chronic small-vessel ischemic changes affect pons. No focal cerebellar finding. There are moderate chronic small-vessel ischemic changes of the cerebral hemispheric white matter. No cortical or large vessel territory infarction. No mass lesion, hemorrhage, hydrocephalus or extra-axial collection. Vascular: Major vessels at the base of the brain show flow. Skull and upper cervical spine: Negative Sinuses/Orbits: Clear/normal Other: None IMPRESSION: No acute or reversible finding. Moderate chronic small-vessel ischemic changes affecting the pons and cerebral hemispheric white matter. Electronically Signed   By: Paulina Fusi M.D.   On: 07/12/2020 19:21   CARDIAC CATHETERIZATION  Result Date: 06/28/2020 Successful transvenous pacemaker placement via the right common femoral vein. Recommendations: During pacemaker placement, the patient was noted to have some intrinsic rhythm with heart rate in the high 40s and low 50s.  Thus, I elected to start the pacemaker rate at 40 bpm to allow intrinsic rhythm.  The patient's hypotension improved with IV fluids and dopamine. Recommend admission to the intensive care unit for further work-up of hypothermia, anemia and acute renal failure. Continue to hold all antihypertensive medications including metoprolol which is the likely culprit for bradycardia in the setting of acute renal failure with subsequent accumulation. I suspect that the patient's most likely will not require permanent pacemaker.  Once we know that her bradycardia has resolved, the temporary pacemaker can be removed.  US  RENAL  Result Date: 07/20/2020 CLINICAL DATA:  Acute renal failure EXAM: RENAL / URINARY TRACT ULTRASOUND COMPLETE COMPARISON:  None. FINDINGS: Right Kidney: Renal measurements: 9.3 x 4.1 x 4.3 cm = volume: 87 mL. Increased echogenicity. No hydronephrosis. Left Kidney: Renal measurements: 7.6 x 5.4 x 5.1 cm = volume: 110 mL. Increased echogenicity. No hydronephrosis. Bladder: Foley catheter within.  Bladder not entirely decompressed. Other: None. IMPRESSION: 1.  No hydronephrosis. 2. Increased renal echogenicity, suggesting medical renal disease. 3. Bladder not entirely decompressed by Foley catheter. Correlate with catheter function. Electronically Signed   By: Jeronimo Greaves M.D.   On: 07/20/2020 12:18   DG Chest Port 1 View  Result Date: 07/22/2020 CLINICAL DATA:  Follow-up shortness of breath. EXAM: PORTABLE CHEST 1 VIEW COMPARISON:  07/21/2020 and multiple previous FINDINGS: Artifact overlies the chest. Right internal jugular central line tip again at the SVC RA junction. Persistence and probable slight worsening of diffuse airspace lung density. Findings could be due to edema, ARDS or pneumonia. No new qualitative finding. No effusion. Bony structures unremarkable. IMPRESSION: Persistent and probable slight worsening of diffuse airspace density. Findings could be due to edema, ARDS or pneumonia. Electronically Signed   By: Paulina Fusi M.D.   On: 07/22/2020 20:37   DG Chest Port 1 View  Result Date: 07/21/2020 CLINICAL DATA:  Altered mental status. Acute kidney injury. Bradycardia. EXAM: PORTABLE CHEST 1 VIEW COMPARISON:  07/19/2020 FINDINGS: Right jugular central venous catheter remains in appropriate position. Asymmetric airspace disease is again seen involving right lung greater than left, without significant change. No evidence of pleural effusion or pneumothorax. Heart size remains normal. IMPRESSION: No significant change in asymmetric airspace disease, right lung greater than left.  Electronically Signed   By: Danae Orleans M.D.   On: 07/21/2020 08:25  DG Chest Port 1 View  Result Date: 07/19/2020 CLINICAL DATA:  75 year old female with shortness of breath EXAM: PORTABLE CHEST 1 VIEW COMPARISON:  Chest radiograph dated 07/19/2020. FINDINGS: Interval removal of the pacer wire. Right IJ dialysis catheter with tip at the cavoatrial junction. Bilateral pulmonary opacities, right greater left, improved since the prior radiograph. No pleural effusion or pneumothorax. Stable cardiac silhouette. Atherosclerotic calcification of the aorta. No acute osseous pathology. IMPRESSION: Slight interval improvement of bilateral airspace opacities. Electronically Signed   By: Elgie Collard M.D.   On: 07/19/2020 20:25   DG Chest Port 1 View  Result Date: 07/19/2020 CLINICAL DATA:  Central line placement EXAM: PORTABLE CHEST 1 VIEW COMPARISON:  Earlier today FINDINGS: New dialysis catheter on the right with tip at the upper cavoatrial junction. No pneumothorax. Asymmetric extensive airspace disease on the right more than left. No visible pleural effusion. Cardiomegaly. There is a temporary pacer from below which has a more vertical position today. Remote mid left clavicle fracture. IMPRESSION: 1. New dialysis catheter without complicating feature. 2. Temporary pacer from below with more vertical positioning of the distal lead, but still likely overlapping the right ventricle. 3. Stable airspace disease. Electronically Signed   By: Marnee Spring M.D.   On: 07/19/2020 05:54   DG Chest Port 1 View  Result Date: 07/19/2020 CLINICAL DATA:  Respiratory failure. EXAM: PORTABLE CHEST 1 VIEW COMPARISON:  07/30/2020 FINDINGS: 0350 hours. The cardio pericardial silhouette is enlarged. Bilateral airspace disease, right greater than left, is similar to prior although progressive in the right base. No pleural effusion. Old left clavicle fracture noted. Telemetry leads overlie the chest. IMPRESSION: Asymmetric  right greater than left airspace disease, similar to prior although it is progressive at the right base. Electronically Signed   By: Kennith Center M.D.   On: 07/19/2020 04:03   DG Chest Port 1 View  Result Date: 07-30-2020 CLINICAL DATA:  75 year old female status post pacemaker placement. EXAM: PORTABLE CHEST 1 VIEW COMPARISON:  Chest radiograph dated 2020-07-30. FINDINGS: There is cardiomegaly with vascular congestion and probable mild edema. Patchy area of density in the left mid lung field may represent edema or pneumonia. No pleural effusion pneumothorax. No acute osseous pathology. A pacer wire noted with tip over the spine. IMPRESSION: Mild cardiomegaly and mild vascular congestion. Patchy density in the left mid lung field may represent edema or pneumonia. No pneumothorax. Electronically Signed   By: Elgie Collard M.D.   On: 07/30/2020 18:06   DG Chest Port 1 View  Result Date: 07-30-20 CLINICAL DATA:  Questionable sepsis. EXAM: PORTABLE CHEST 1 VIEW COMPARISON:  None. FINDINGS: The heart size is enlarged. Evaluation of the left lung field is limited by an overlying defibrillator pad. There appear to be diffuse hazy airspace opacities in the left lung field and to a lesser degree the right. Kerley B lines are noted. There are probable trace bilateral pleural effusions. There is no pneumothorax. There is an old left clavicle fracture. No definite acute displaced fracture. IMPRESSION: 1. Cardiomegaly with pulmonary edema. 2. Probable trace bilateral pleural effusions. 3. Hazy airspace opacities, left greater than right, may represent an atypical infectious process or developing pulmonary edema. Electronically Signed   By: Katherine Mantle M.D.   On: 30-Jul-2020 16:14   ECHOCARDIOGRAM COMPLETE  Result Date: 07/19/2020    ECHOCARDIOGRAM REPORT   Patient Name:   KHALIYA GOLINSKI Date of Exam: 07/19/2020 Medical Rec #:  [id number]       Height:  63.0 in Accession #:    [id number]       Weight:       160.0 lb Date of Birth:  [birth year]        BSA:          1.759 m Patient Age:    74 years        BP:           104/42 mmHg Patient Gender: F               HR:           49 bpm. Exam Location:  [facility] Procedure: 2D Echo Indications:     Acute Respiratory Distress  History:         Patient has no prior history of Echocardiogram examinations.                  Stroke; Risk Factors:Hypertension.  Sonographer:     L Thornton-Maynard Referring Phys:  [id number] Eugenie Norrie Diagnosing Phys: Kristeen Miss MD IMPRESSIONS  1. Left ventricular ejection fraction, by estimation, is 55 to 60%. The left ventricle has normal function. The left ventricle has no regional wall motion abnormalities. Left ventricular diastolic parameters are indeterminate.  2. Right ventricular systolic function is normal. The right ventricular size is normal. There is moderately elevated pulmonary artery systolic pressure.  3. The mitral valve is normal in structure. Mild mitral valve regurgitation.  4. Tricuspid valve regurgitation is moderate.  5. The aortic valve is normal in structure. Aortic valve regurgitation is not visualized. FINDINGS  Left Ventricle: Left ventricular ejection fraction, by estimation, is 55 to 60%. The left ventricle has normal function. The left ventricle has no regional wall motion abnormalities. The left ventricular internal cavity size was normal in size. There is  no left ventricular hypertrophy. Left ventricular diastolic parameters are indeterminate. Right Ventricle: The right ventricular size is normal. No increase in right ventricular wall thickness. Right ventricular systolic function is normal. There is moderately elevated pulmonary artery systolic pressure. The tricuspid regurgitant velocity is 2.82 m/s, and with an assumed right atrial pressure of 15 mmHg, the estimated right ventricular systolic pressure is 46.8 mmHg. Left Atrium: Left atrial size was normal in size. Right Atrium: Right atrial size was  normal in size. Pericardium: There is no evidence of pericardial effusion. Mitral Valve: The mitral valve is normal in structure. Mild mitral valve regurgitation. Tricuspid Valve: The tricuspid valve is grossly normal. Tricuspid valve regurgitation is moderate. Aortic Valve: The aortic valve is normal in structure. Aortic valve regurgitation is not visualized. Aortic regurgitation PHT measures 600 msec. Aortic valve mean gradient measures 5.0 mmHg. Aortic valve peak gradient measures 10.5 mmHg. Aortic valve area, by VTI measures 1.63 cm. Pulmonic Valve: The pulmonic valve was not well visualized. Pulmonic valve regurgitation is not visualized. Aorta: The aortic root and ascending aorta are structurally normal, with no evidence of dilitation. IAS/Shunts: The atrial septum is grossly normal.  LEFT VENTRICLE PLAX 2D LVIDd:         4.20 cm  Diastology LVIDs:         3.49 cm  LV e' medial:    8.49 cm/s LV PW:         0.67 cm  LV E/e' medial:  10.3 LV IVS:        0.59 cm  LV e' lateral:   10.40 cm/s LVOT diam:     2.00 cm  LV E/e' lateral: 8.4 LV SV:  48 LV SV Index:   28 LVOT Area:     3.14 cm  RIGHT VENTRICLE RV S prime:     9.90 cm/s TAPSE (M-mode): 2.0 cm LEFT ATRIUM             Index LA diam:        2.70 cm 1.54 cm/m LA Vol (A2C):   49.9 ml 28.37 ml/m LA Vol (A4C):   30.8 ml 17.51 ml/m LA Biplane Vol: 39.0 ml 22.18 ml/m  AORTIC VALVE AV Area (Vmax):    1.48 cm AV Area (Vmean):   1.70 cm AV Area (VTI):     1.63 cm AV Vmax:           162.00 cm/s AV Vmean:          101.000 cm/s AV VTI:            0.296 m AV Peak Grad:      10.5 mmHg AV Mean Grad:      5.0 mmHg LVOT Vmax:         76.10 cm/s LVOT Vmean:        54.800 cm/s LVOT VTI:          0.154 m LVOT/AV VTI ratio: 0.52 AI PHT:            600 msec  AORTA Ao Root diam: 2.40 cm MITRAL VALVE               TRICUSPID VALVE MV Area (PHT): 3.01 cm    TR Peak grad:   31.8 mmHg MV E velocity: 87.55 cm/s  TR Vmax:        282.00 cm/s MV A velocity: 55.80 cm/s MV  E/A ratio:  1.57        SHUNTS                            Systemic VTI:  0.15 m                            Systemic Diam: 2.00 cm Kristeen MissPhilip Nahser MD Electronically signed by Kristeen MissPhilip Nahser MD Signature Date/Time: 07/19/2020/2:05:19 PM    Final     Microbiology No results found for this or any previous visit (from the past 240 hour(s)).  Lab Basic Metabolic Panel: No results for input(s): NA, K, CL, CO2, GLUCOSE, BUN, CREATININE, CALCIUM, MG, PHOS in the last 168 hours. Liver Function Tests: No results for input(s): AST, ALT, ALKPHOS, BILITOT, PROT, ALBUMIN in the last 168 hours. No results for input(s): LIPASE, AMYLASE in the last 168 hours. No results for input(s): AMMONIA in the last 168 hours. CBC: No results for input(s): WBC, NEUTROABS, HGB, HCT, MCV, PLT in the last 168 hours. Cardiac Enzymes: No results for input(s): CKTOTAL, CKMB, CKMBINDEX, TROPONINI in the last 168 hours. Sepsis Labs: No results for input(s): PROCALCITON, WBC, LATICACIDVEN in the last 168 hours.   Lorin GlassDaniel C Phylisha Dix 08/08/2020, 3:01 PM

## 2020-09-10 LAB — BLOOD GAS, ARTERIAL
Acid-base deficit: 7.1 mmol/L — ABNORMAL HIGH (ref 0.0–2.0)
Bicarbonate: 17.7 mmol/L — ABNORMAL LOW (ref 20.0–28.0)
FIO2: 1
Mechanical Rate: 10
O2 Saturation: 68.8 %
Patient temperature: 37
pCO2 arterial: 32 mmHg (ref 32.0–48.0)
pH, Arterial: 7.35 (ref 7.350–7.450)

## 2021-04-13 IMAGING — DX DG ABDOMEN 1V
1 series · 1 of 1 positions shown · non-contrast
Comparison: CT 11/06/2019

CLINICAL DATA: NG tube placement

EXAM:
ABDOMEN - 1 VIEW

[abdomen supine]
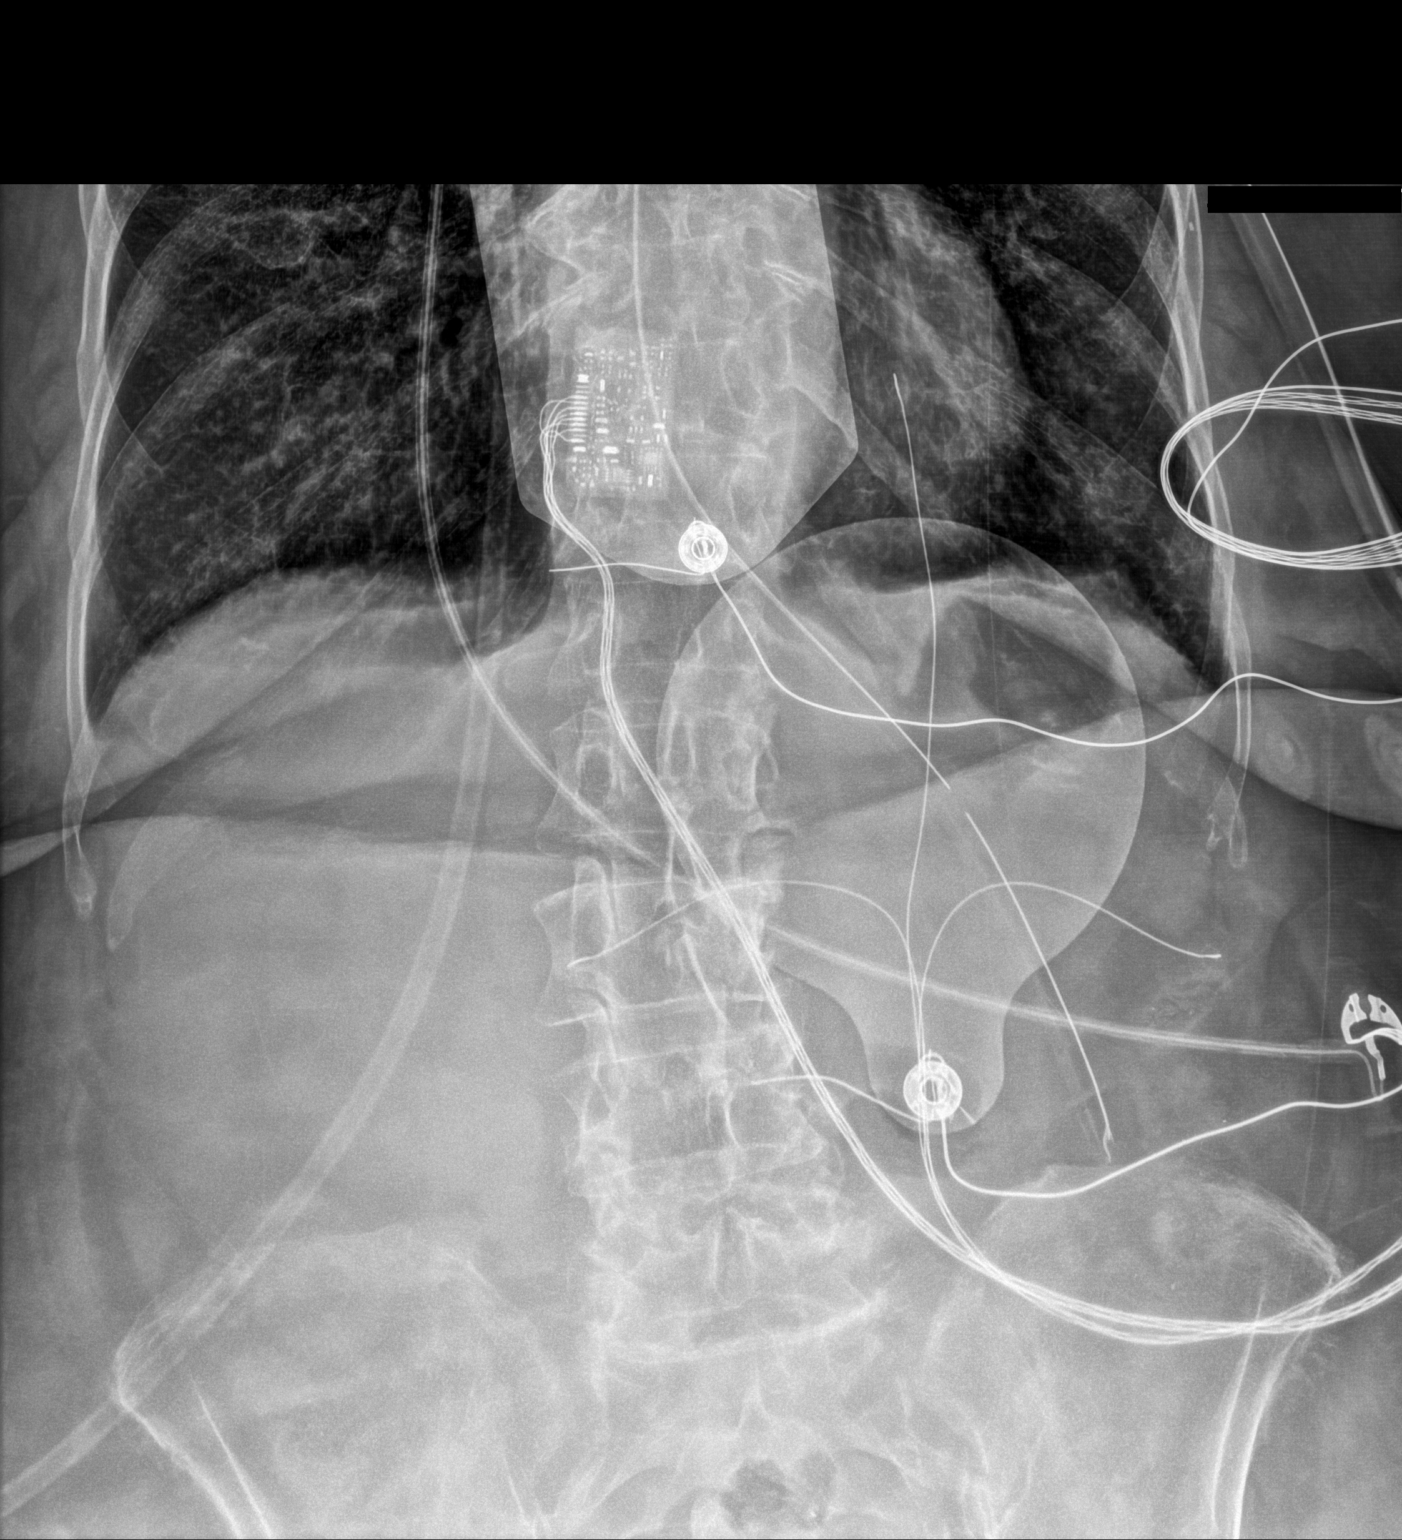

[1 of 1 positions shown; findings below may reference images not displayed]

FINDINGS: Esophageal tube tip overlies the proximal to mid stomach, side-port
in the region of gastric fundus. Visualized abdomen is relatively
gasless.
IMPRESSION: Esophageal tube tip overlies the proximal to mid stomach.

## 2021-04-13 IMAGING — CT CT HEAD W/O CM
3 series · 16 of 47 positions shown, 19 images · non-contrast
Comparison: MRI brain dated 07/11/2020

CLINICAL DATA: Altered mental status

EXAM:
CT HEAD WITHOUT CONTRAST
TECHNIQUE: Contiguous axial images were obtained from the base of the skull
through the vertex without intravenous contrast.

[Series 3: head wo · axial · 0.41mm/px · z∈[+66,+191]mm · 10 of 30 slices shown, 13 images]
[im 3/30  brain]
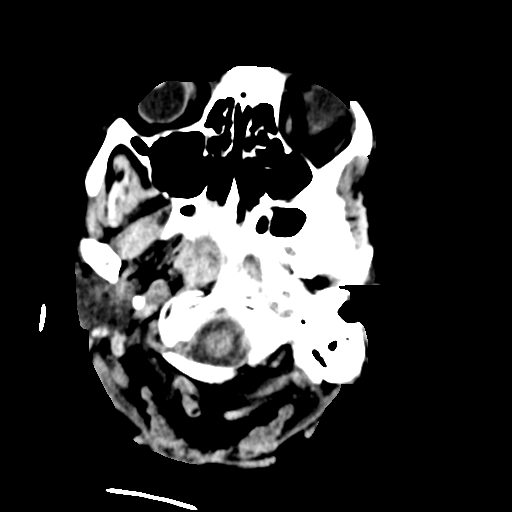
[im 3/30  bone]
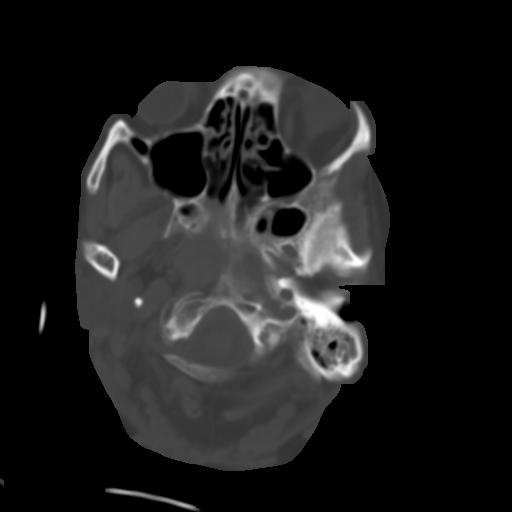
[im 6/30  brain]
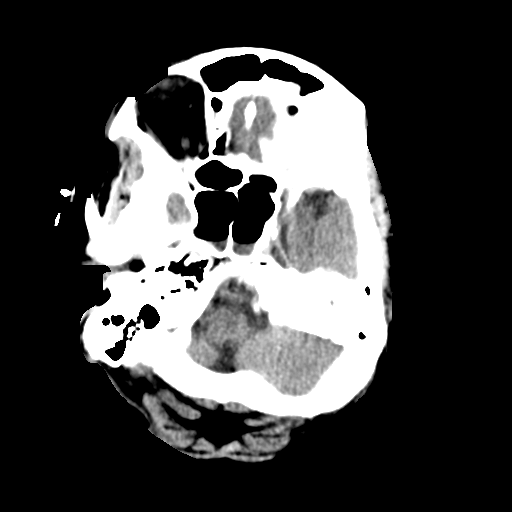
[im 9/30  brain]
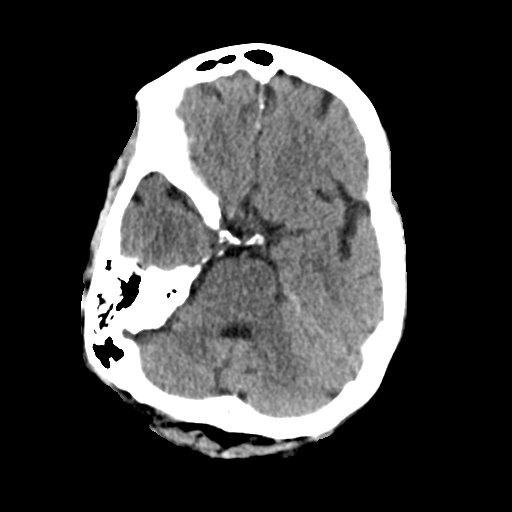
[im 11/30  brain]
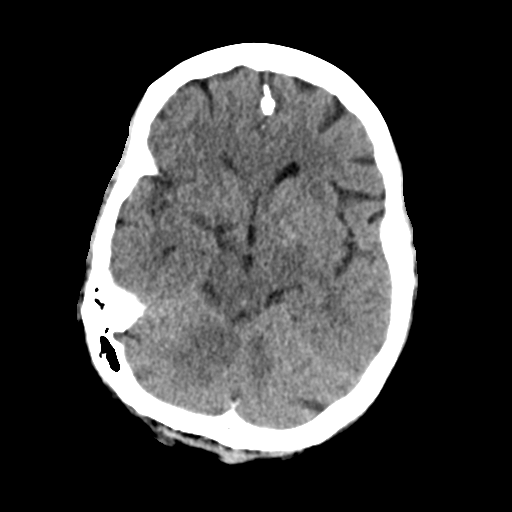
[im 14/30  brain]
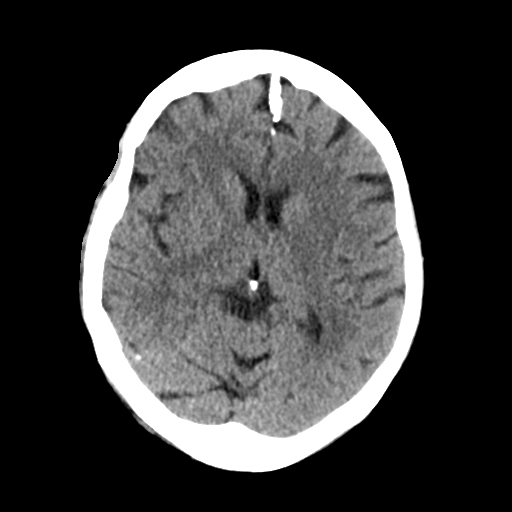
[im 14/30  bone]
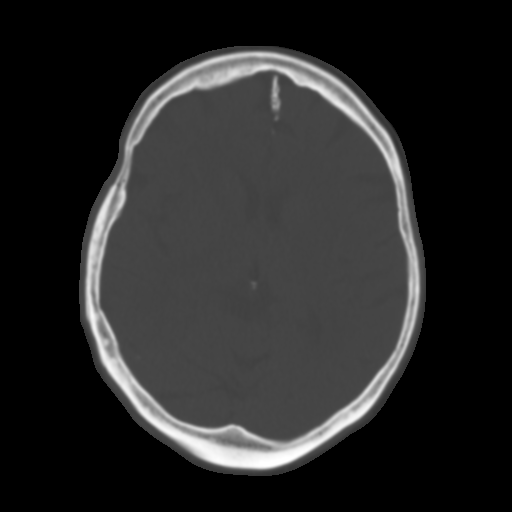
[im 17/30  brain]
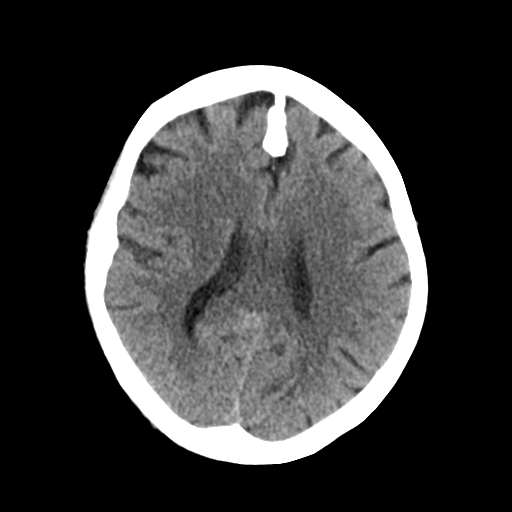
[im 20/30  brain]
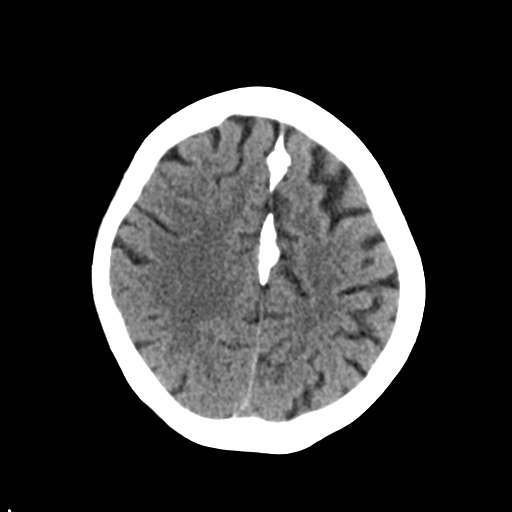
[im 23/30  brain]
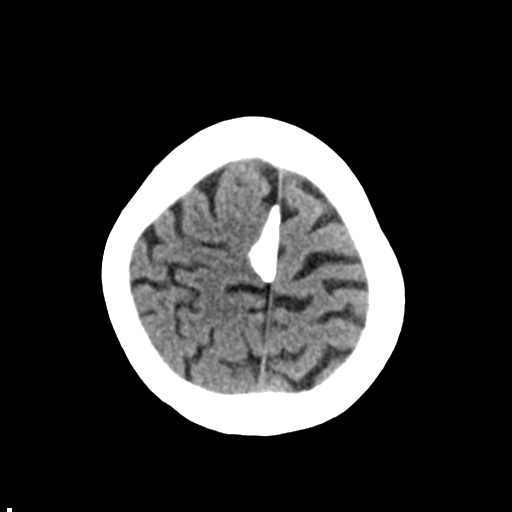
[im 25/30  brain]
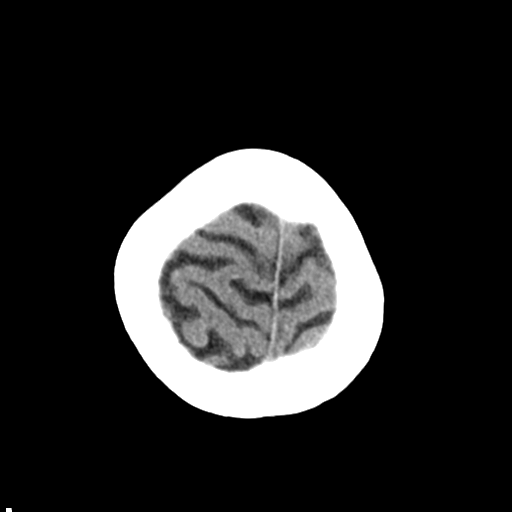
[im 25/30  bone]
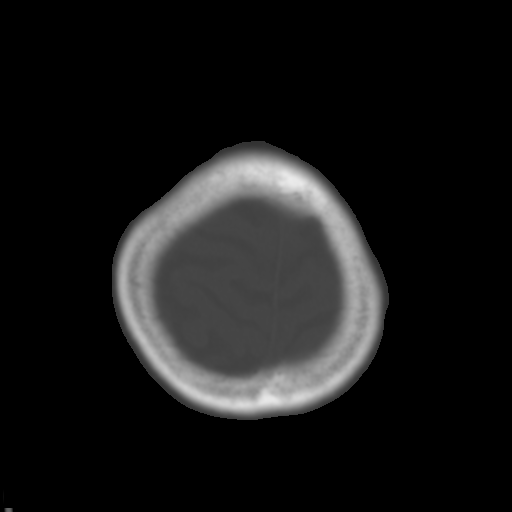
[im 28/30  brain]
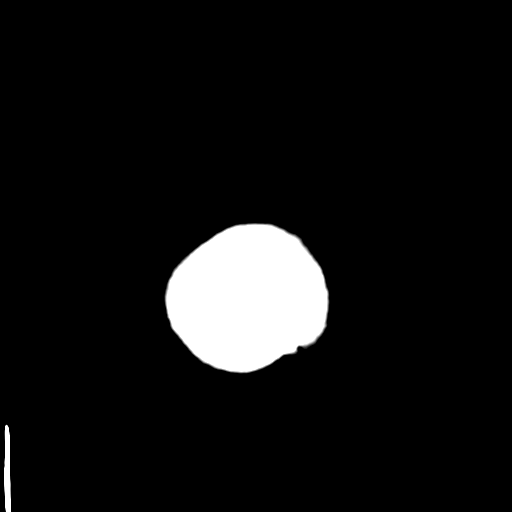

[Series 4: coronal soft tissue · coronal · 0.34mm/px · 3 of 64 slices shown]
[im 22/64  brain]
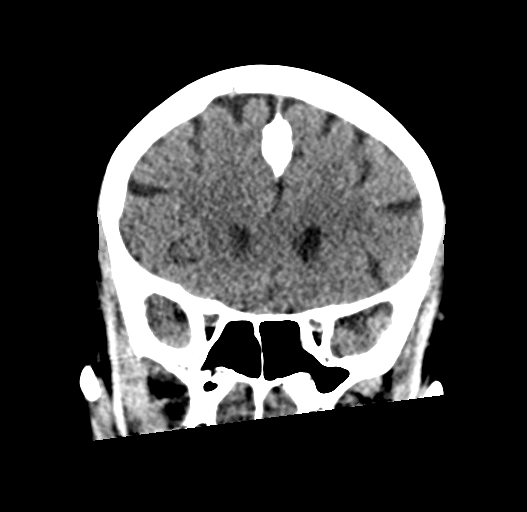
[im 29/64  brain]
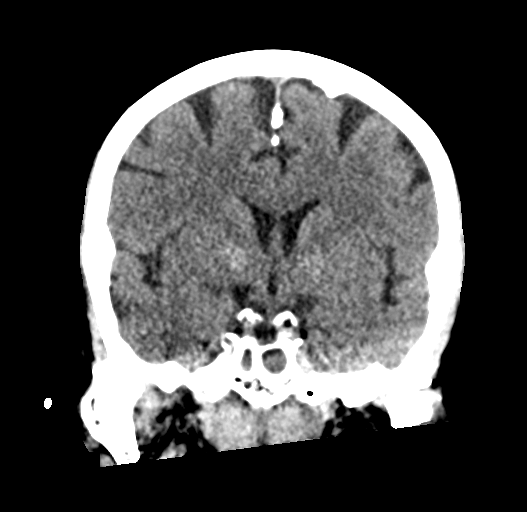
[im 36/64  brain]
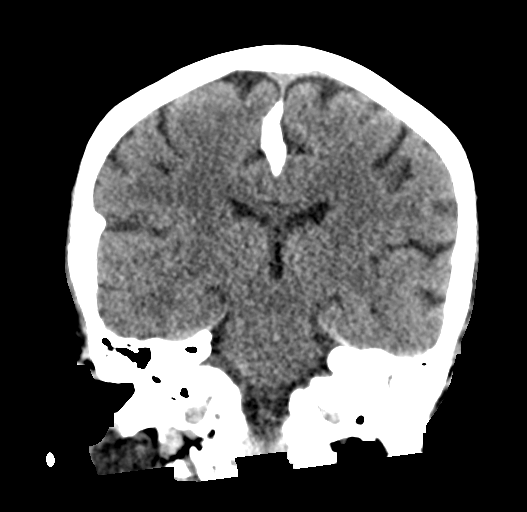

[Series 5: sagittal soft tissue · sagittal · 0.35mm/px · 3 of 50 slices shown]
[im 17/50  brain]
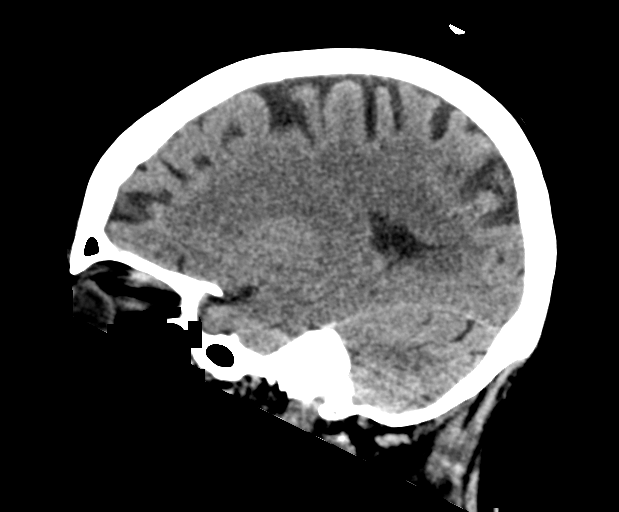
[im 25/50  brain]
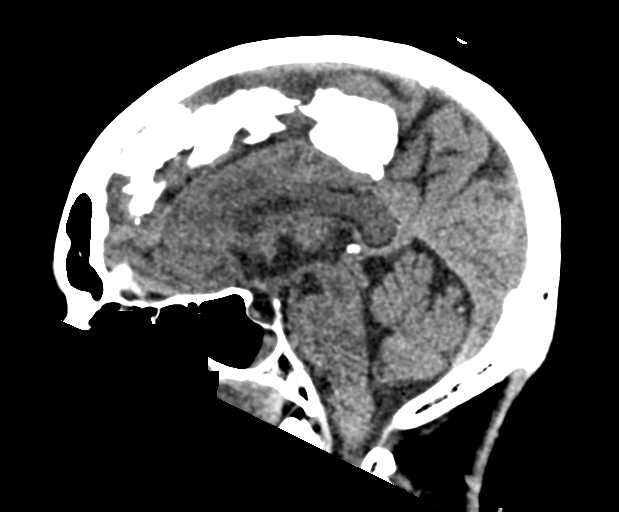
[im 33/50  brain]
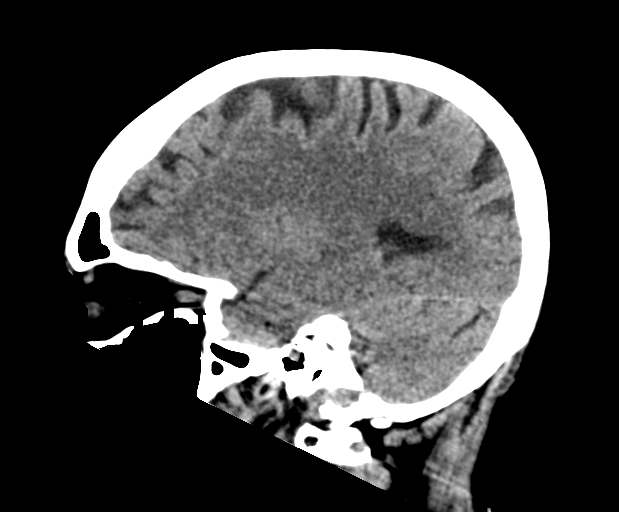

[16 of 47 positions shown; findings below may reference images not displayed]

FINDINGS: Brain: No evidence of acute infarction, hemorrhage, hydrocephalus,
extra-axial collection or mass lesion/mass effect.

Mild subcortical white matter and periventricular small vessel
ischemic changes.

Vascular: Intracranial atherosclerosis.

Skull: Normal. Negative for fracture or focal lesion.

Sinuses/Orbits: The visualized paranasal sinuses are essentially
clear. The mastoid air cells are unopacified.

Other: None.
IMPRESSION: No evidence of acute intracranial abnormality.

Mild small vessel ischemic changes.
